# Patient Record
Sex: Female | Born: 1970 | Race: White | Hispanic: No | Marital: Married | Smoking: Never smoker
Health system: Southern US, Community
[De-identification: ages and names within clinical notes are randomized; demographics above are authoritative.]

## PROBLEM LIST (undated history)

## (undated) DIAGNOSIS — E060 Acute thyroiditis: Secondary | ICD-10-CM

## (undated) DIAGNOSIS — E7800 Pure hypercholesterolemia, unspecified: Secondary | ICD-10-CM

## (undated) DIAGNOSIS — Z87442 Personal history of urinary calculi: Secondary | ICD-10-CM

## (undated) DIAGNOSIS — Z9889 Other specified postprocedural states: Secondary | ICD-10-CM

## (undated) DIAGNOSIS — E519 Thiamine deficiency, unspecified: Secondary | ICD-10-CM

## (undated) DIAGNOSIS — R05 Cough: Secondary | ICD-10-CM

## (undated) DIAGNOSIS — K429 Umbilical hernia without obstruction or gangrene: Secondary | ICD-10-CM

## (undated) DIAGNOSIS — E069 Thyroiditis, unspecified: Secondary | ICD-10-CM

## (undated) DIAGNOSIS — G4700 Insomnia, unspecified: Secondary | ICD-10-CM

## (undated) DIAGNOSIS — I10 Essential (primary) hypertension: Secondary | ICD-10-CM

## (undated) DIAGNOSIS — C801 Malignant (primary) neoplasm, unspecified: Secondary | ICD-10-CM

## (undated) DIAGNOSIS — R059 Cough, unspecified: Secondary | ICD-10-CM

## (undated) DIAGNOSIS — Z973 Presence of spectacles and contact lenses: Secondary | ICD-10-CM

## (undated) DIAGNOSIS — R002 Palpitations: Secondary | ICD-10-CM

## (undated) DIAGNOSIS — F411 Generalized anxiety disorder: Secondary | ICD-10-CM

## (undated) DIAGNOSIS — E785 Hyperlipidemia, unspecified: Secondary | ICD-10-CM

## (undated) DIAGNOSIS — G4730 Sleep apnea, unspecified: Secondary | ICD-10-CM

## (undated) DIAGNOSIS — N329 Bladder disorder, unspecified: Secondary | ICD-10-CM

## (undated) DIAGNOSIS — R112 Nausea with vomiting, unspecified: Secondary | ICD-10-CM

## (undated) DIAGNOSIS — S93402A Sprain of unspecified ligament of left ankle, initial encounter: Secondary | ICD-10-CM

## (undated) DIAGNOSIS — F329 Major depressive disorder, single episode, unspecified: Secondary | ICD-10-CM

## (undated) DIAGNOSIS — M2550 Pain in unspecified joint: Secondary | ICD-10-CM

## (undated) DIAGNOSIS — G2581 Restless legs syndrome: Secondary | ICD-10-CM

## (undated) HISTORY — PX: OTHER SURGICAL HISTORY: SHX169

## (undated) HISTORY — DX: Pure hypercholesterolemia, unspecified: E78.00

## (undated) HISTORY — DX: Thyroiditis, unspecified: E06.9

## (undated) HISTORY — DX: Thiamine deficiency, unspecified: E51.9

## (undated) HISTORY — PX: UMBILICAL HERNIA REPAIR: SHX196

## (undated) HISTORY — DX: Insomnia, unspecified: G47.00

## (undated) HISTORY — DX: Cough: R05

## (undated) HISTORY — DX: Acute thyroiditis: E06.0

## (undated) HISTORY — DX: Sprain of unspecified ligament of left ankle, initial encounter: S93.402A

## (undated) HISTORY — DX: Umbilical hernia without obstruction or gangrene: K42.9

## (undated) HISTORY — DX: Restless legs syndrome: G25.81

## (undated) HISTORY — DX: Pain in unspecified joint: M25.50

## (undated) HISTORY — PX: GALLBLADDER SURGERY: SHX652

## (undated) HISTORY — DX: Hyperlipidemia, unspecified: E78.5

## (undated) HISTORY — DX: Cough, unspecified: R05.9

## (undated) HISTORY — DX: Generalized anxiety disorder: F41.1

## (undated) HISTORY — DX: Major depressive disorder, single episode, unspecified: F32.9

## (undated) HISTORY — DX: Palpitations: R00.2

## (undated) HISTORY — DX: Essential (primary) hypertension: I10

---
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
## 1993-06-16 HISTORY — PX: CHOLECYSTECTOMY: SHX55

## 1998-08-01 ENCOUNTER — Other Ambulatory Visit: Admission: RE | Admit: 1998-08-01 | Discharge: 1998-08-01 | Payer: Self-pay | Admitting: Gynecology

## 1999-09-09 ENCOUNTER — Other Ambulatory Visit: Admission: RE | Admit: 1999-09-09 | Discharge: 1999-09-09 | Payer: Self-pay | Admitting: Gynecology

## 2000-09-22 ENCOUNTER — Other Ambulatory Visit: Admission: RE | Admit: 2000-09-22 | Discharge: 2000-09-22 | Payer: Self-pay | Admitting: Gynecology

## 2001-10-28 ENCOUNTER — Other Ambulatory Visit: Admission: RE | Admit: 2001-10-28 | Discharge: 2001-10-28 | Payer: Self-pay | Admitting: Gynecology

## 2002-06-16 DIAGNOSIS — E060 Acute thyroiditis: Secondary | ICD-10-CM

## 2002-06-16 HISTORY — DX: Acute thyroiditis: E06.0

## 2002-10-11 ENCOUNTER — Encounter (HOSPITAL_COMMUNITY): Admission: RE | Admit: 2002-10-11 | Discharge: 2003-01-09 | Payer: Self-pay | Admitting: Endocrinology

## 2002-10-12 ENCOUNTER — Encounter: Payer: Self-pay | Admitting: Endocrinology

## 2002-10-27 ENCOUNTER — Other Ambulatory Visit: Admission: RE | Admit: 2002-10-27 | Discharge: 2002-10-27 | Payer: Self-pay | Admitting: Gynecology

## 2003-10-03 ENCOUNTER — Ambulatory Visit (HOSPITAL_COMMUNITY): Admission: RE | Admit: 2003-10-03 | Discharge: 2003-10-03 | Payer: Self-pay | Admitting: Family Medicine

## 2004-02-27 ENCOUNTER — Other Ambulatory Visit: Admission: RE | Admit: 2004-02-27 | Discharge: 2004-02-27 | Payer: Self-pay | Admitting: Gynecology

## 2004-10-03 ENCOUNTER — Emergency Department (HOSPITAL_COMMUNITY): Admission: EM | Admit: 2004-10-03 | Discharge: 2004-10-04 | Payer: Self-pay | Admitting: Emergency Medicine

## 2005-07-31 ENCOUNTER — Other Ambulatory Visit: Admission: RE | Admit: 2005-07-31 | Discharge: 2005-07-31 | Payer: Self-pay | Admitting: Gynecology

## 2008-01-28 ENCOUNTER — Emergency Department (HOSPITAL_COMMUNITY): Admission: EM | Admit: 2008-01-28 | Discharge: 2008-01-29 | Payer: Self-pay | Admitting: Emergency Medicine

## 2009-06-16 DIAGNOSIS — R002 Palpitations: Secondary | ICD-10-CM

## 2009-06-16 HISTORY — DX: Palpitations: R00.2

## 2010-05-13 ENCOUNTER — Emergency Department (HOSPITAL_COMMUNITY): Admission: EM | Admit: 2010-05-13 | Discharge: 2010-05-13 | Payer: Self-pay | Admitting: Emergency Medicine

## 2010-08-28 LAB — POCT CARDIAC MARKERS
CKMB, poc: <1 ng/mL — ABNORMAL LOW (ref 1.0–8.0)
Myoglobin, poc: 32.1 ng/mL (ref 12–200)
Troponin i, poc: <0.05 ng/mL (ref 0.00–0.09)

## 2010-08-28 LAB — DIFFERENTIAL
Basophils Relative: 0 % (ref 0–1)
Eosinophils Absolute: 0.1 10*3/uL (ref 0.0–0.7)
Eosinophils Relative: 1 % (ref 0–5)
Lymphs Abs: 2.8 10*3/uL (ref 0.7–4.0)
Monocytes Absolute: 0.6 10*3/uL (ref 0.1–1.0)
Monocytes Relative: 5 % (ref 3–12)
Neutrophils Relative %: 74 % (ref 43–77)

## 2010-08-28 LAB — POCT I-STAT, CHEM 8
Calcium, Ion: 1.09 mmol/L — ABNORMAL LOW (ref 1.12–1.32)
Creatinine, Ser: 0.7 mg/dL (ref 0.4–1.2)
Glucose, Bld: 118 mg/dL — ABNORMAL HIGH (ref 70–99)
HCT: 43 % (ref 36.0–46.0)
Hemoglobin: 14.6 g/dL (ref 12.0–15.0)
Potassium: 3.6 mEq/L (ref 3.5–5.1)
TCO2: 23 mmol/L (ref 0–100)

## 2010-08-28 LAB — CBC
Hemoglobin: 14.2 g/dL (ref 12.0–15.0)
MCH: 31.5 pg (ref 26.0–34.0)
MCHC: 35.1 g/dL (ref 30.0–36.0)
MCV: 89.7 fL (ref 78.0–100.0)

## 2010-08-28 LAB — URINALYSIS, ROUTINE W REFLEX MICROSCOPIC
Nitrite: NEGATIVE
Specific Gravity, Urine: 1.015 (ref 1.005–1.030)
Urobilinogen, UA: 1 mg/dL (ref 0.0–1.0)

## 2010-08-28 LAB — URINE MICROSCOPIC-ADD ON

## 2010-08-28 LAB — URINE CULTURE: Culture: NO GROWTH

## 2010-10-15 DIAGNOSIS — S93402A Sprain of unspecified ligament of left ankle, initial encounter: Secondary | ICD-10-CM

## 2010-10-15 HISTORY — DX: Sprain of unspecified ligament of left ankle, initial encounter: S93.402A

## 2010-11-11 ENCOUNTER — Emergency Department (INDEPENDENT_AMBULATORY_CARE_PROVIDER_SITE_OTHER): Payer: 59

## 2010-11-11 ENCOUNTER — Emergency Department (HOSPITAL_BASED_OUTPATIENT_CLINIC_OR_DEPARTMENT_OTHER)
Admission: EM | Admit: 2010-11-11 | Discharge: 2010-11-11 | Disposition: A | Discharge status: Home / Self Care | Payer: 59 | Attending: Emergency Medicine | Admitting: Emergency Medicine

## 2010-11-11 DIAGNOSIS — M25579 Pain in unspecified ankle and joints of unspecified foot: Secondary | ICD-10-CM

## 2010-11-11 DIAGNOSIS — Y9239 Other specified sports and athletic area as the place of occurrence of the external cause: Secondary | ICD-10-CM | POA: Insufficient documentation

## 2010-11-11 DIAGNOSIS — Y92838 Other recreation area as the place of occurrence of the external cause: Secondary | ICD-10-CM | POA: Insufficient documentation

## 2010-11-11 DIAGNOSIS — X500XXA Overexertion from strenuous movement or load, initial encounter: Secondary | ICD-10-CM | POA: Insufficient documentation

## 2010-11-11 DIAGNOSIS — I10 Essential (primary) hypertension: Secondary | ICD-10-CM | POA: Insufficient documentation

## 2010-11-11 DIAGNOSIS — S93409A Sprain of unspecified ligament of unspecified ankle, initial encounter: Secondary | ICD-10-CM | POA: Insufficient documentation

## 2010-11-11 DIAGNOSIS — R609 Edema, unspecified: Secondary | ICD-10-CM

## 2010-11-11 DIAGNOSIS — Y9353 Activity, golf: Secondary | ICD-10-CM | POA: Insufficient documentation

## 2011-05-04 ENCOUNTER — Encounter: Payer: Self-pay | Admitting: Emergency Medicine

## 2011-05-04 ENCOUNTER — Observation Stay (HOSPITAL_COMMUNITY)
Admission: EM | Admit: 2011-05-04 | Discharge: 2011-05-04 | Disposition: A | Discharge status: Home / Self Care | Payer: 59 | Attending: Emergency Medicine | Admitting: Emergency Medicine

## 2011-05-04 ENCOUNTER — Emergency Department (HOSPITAL_COMMUNITY): Payer: 59

## 2011-05-04 DIAGNOSIS — F3289 Other specified depressive episodes: Secondary | ICD-10-CM | POA: Insufficient documentation

## 2011-05-04 DIAGNOSIS — L02219 Cutaneous abscess of trunk, unspecified: Principal | ICD-10-CM | POA: Insufficient documentation

## 2011-05-04 DIAGNOSIS — L03311 Cellulitis of abdominal wall: Secondary | ICD-10-CM

## 2011-05-04 DIAGNOSIS — I10 Essential (primary) hypertension: Secondary | ICD-10-CM | POA: Insufficient documentation

## 2011-05-04 DIAGNOSIS — F329 Major depressive disorder, single episode, unspecified: Secondary | ICD-10-CM | POA: Insufficient documentation

## 2011-05-04 DIAGNOSIS — R109 Unspecified abdominal pain: Secondary | ICD-10-CM | POA: Insufficient documentation

## 2011-05-04 HISTORY — DX: Bladder disorder, unspecified: N32.9

## 2011-05-04 LAB — CBC
Hemoglobin: 13.6 g/dL (ref 12.0–15.0)
MCH: 31.1 pg (ref 26.0–34.0)
MCHC: 33.6 g/dL (ref 30.0–36.0)
Platelets: 353 10*3/uL (ref 150–400)
RBC: 4.38 MIL/uL (ref 3.87–5.11)

## 2011-05-04 LAB — DIFFERENTIAL
Basophils Relative: 0 % (ref 0–1)
Eosinophils Absolute: 0.1 10*3/uL (ref 0.0–0.7)
Lymphs Abs: 2.5 10*3/uL (ref 0.7–4.0)
Monocytes Relative: 6 % (ref 3–12)
Neutro Abs: 6.9 10*3/uL (ref 1.7–7.7)
Neutrophils Relative %: 68 % (ref 43–77)

## 2011-05-04 LAB — POCT PREGNANCY, URINE: Preg Test, Ur: NEGATIVE

## 2011-05-04 LAB — BASIC METABOLIC PANEL
Calcium: 9.4 mg/dL (ref 8.4–10.5)
GFR calc Af Amer: >90 mL/min (ref >90)
GFR calc non Af Amer: >90 mL/min (ref >90)
Glucose, Bld: 90 mg/dL (ref 70–99)
Potassium: 4.3 mEq/L (ref 3.5–5.1)
Sodium: 140 mEq/L (ref 135–145)

## 2011-05-04 MED ORDER — CEPHALEXIN 500 MG PO CAPS: 500.0000 mg | ORAL_CAPSULE | Freq: Four times a day (QID) | ORAL | Status: AC

## 2011-05-04 MED ORDER — IOHEXOL 300 MG/ML  SOLN
100.0000 mL | Freq: Once | INTRAMUSCULAR | Status: AC | PRN
  Administered 2011-05-04: 100 mL via INTRAVENOUS

## 2011-05-04 NOTE — ED Provider Notes (Signed)
Medical screening examination/treatment/procedure(s) were conducted as a shared visit with non-physician practitioner(s) and myself.  I personally evaluated the patient during the encounter   Mirna Sutcliffe W Elvie Maines, MD 05/04/11 1613 

## 2011-05-04 NOTE — ED Notes (Signed)
Pt presents with erythema and swelling to abdomen for two days, pt states green drainage noted coming from abdomen

## 2011-05-04 NOTE — ED Provider Notes (Signed)
History     CSN: [id number] Arrival date & time: 05/04/2011  9:25 AM   First MD Initiated Contact with Patient 05/04/11 [phone]      Chief Complaint  Patient presents with  . Abdominal Pain    (Consider location/radiation/quality/duration/timing/severity/associated sxs/prior treatment) HPI  Past Medical History  Diagnosis Date  . Hypertension   . Depression   . Bladder disorder     Past Surgical History  Procedure Date  . Gallbladder surgery   . Tonsillectomy   . Eye surgery     Family History  Problem Relation Age of Onset  . Heart failure Mother     History  Substance Use Topics  . Smoking status: Never Smoker   . Smokeless tobacco: Not on file  . Alcohol Use: No    OB History    Grav Para Term Preterm Abortions TAB SAB Ect Mult Living                  Review of Systems  Allergies  Erythromycin; Codeine; and Penicillins  Home Medications   Current Outpatient Rx  Name Route Sig Dispense Refill  . BUPROPION HCL ER (XL) 300 MG PO TB24 Oral Take 300 mg by mouth daily.      Marland Kitchen FLUOXETINE HCL 40 MG PO CAPS Oral Take 40 mg by mouth daily.      Marland Kitchen METOPROLOL SUCCINATE 100 MG PO TB24 Oral Take 100 mg by mouth daily.      . CRYSELLE-28 PO Oral Take 1 tablet by mouth daily.        BP 137/77  Temp(Src) 98.1 F (36.7 C) (Oral)  Resp 18  SpO2 98%  Physical Exam  ED Course  Procedures (including critical care time)   Labs Reviewed  BASIC METABOLIC PANEL  CBC  DIFFERENTIAL  POCT PREGNANCY, URINE  POCT PREGNANCY, URINE   Ct Abdomen Pelvis W Contrast  05/04/2011  *RADIOLOGY REPORT*  Clinical Data: Abdominal pain, umbilical drainage  CT ABDOMEN AND PELVIS WITH CONTRAST  Technique:  Multidetector CT imaging of the abdomen and pelvis was performed following the standard protocol during bolus administration of intravenous contrast.  Contrast: OMNIPAQUE IOHEXOL 300 MG/ML IV SOLN  Comparison: 10/03/2003 from [facility]  Findings: Visualized lung bases  clear.  Unremarkable liver, spleen, pancreas, adrenal glands, kidneys, abdominal aorta, stomach, small bowel, appendix, colon.  Vascular clips in the gallbladder fossa.  Urinary bladder physiologically distended.  Uterus and adnexal regions unremarkable.  No ascites. No free air.  No hydronephrosis. Lumbar spine unremarkable.  There is a small umbilical hernia containing mesenteric fat.  There is skin thickening around the umbilicus with underlying inflammatory/edematous changes in the subcutaneous fat.  This does not extend deep to the abdominal fascia. There are no bowel loops in proximity to this process.  No discrete abscess.  IMPRESSION:  1.  Periumbilical skin thickening and regional subcutaneous inflammatory/edematous changes suggesting cellulitis.  No discrete abscess. 2.  Small umbilical hernia containing only mesenteric fat. 3.  No intra abdominal abnormality.  Original Report Authenticated By: Osa Craver, M.D.      Abdominal wall cellulitis   MDM  CT scan without abscess, patient with 9cm x 12cm area of cellulitis - area marked with pen and she will either return here in 2 days or follow up with her PCP in 2 days.  She knows to return with worsening of symptoms including, fever, chills, worsening of redness, increase in drainage.  Izola Price [place], [place] 05/04/11 1258

## 2011-05-04 NOTE — ED Provider Notes (Signed)
History     CSN: [id number] Arrival date & time: 05/04/2011  9:25 AM   First MD Initiated Contact with Patient 05/04/11 0955    PT SEEN WITH MEDICAL STUDENT FLOYD, I PERFORMED HISTORY AND PHYSICAL  Chief Complaint  Patient presents with  . Abdominal Pain     Patient is a 40 y.o. female presenting with abdominal pain. The history is provided by the patient.  Abdominal Pain The primary symptoms of the illness include abdominal pain. The primary symptoms of the illness do not include fever, fatigue, vomiting, dysuria or vaginal discharge. The current episode started 2 days ago. The onset of the illness was gradual. The problem has been gradually worsening.  Symptoms associated with the illness do not include chills or urgency.  pt reports that she has had pain/drainage from her umbilicus for past two days No trauma No h/o piercings No fever/vomiting Now starting have to redness around umbilicus Distant h/o cholecystectomy, no recent surgeries  Past Medical History  Diagnosis Date  . Hypertension   . Depression   . Bladder disorder     Past Surgical History  Procedure Date  . Gallbladder surgery   . Tonsillectomy   . Eye surgery     Family History  Problem Relation Age of Onset  . Heart failure Mother     History  Substance Use Topics  . Smoking status: Never Smoker   . Smokeless tobacco: Not on file  . Alcohol Use: No    OB History    Grav Para Term Preterm Abortions TAB SAB Ect Mult Living                  Review of Systems  Constitutional: Negative for fever, chills and fatigue.  Gastrointestinal: Positive for abdominal pain. Negative for vomiting.  Genitourinary: Negative for dysuria, urgency and vaginal discharge.  All other systems reviewed and are negative.    Allergies  Erythromycin; Codeine; and Penicillins  Home Medications   Current Outpatient Rx  Name Route Sig Dispense Refill  . BUPROPION HCL ER (XL) 300 MG PO TB24 Oral Take 300 mg by  mouth daily.      Marland Kitchen FLUOXETINE HCL 40 MG PO CAPS Oral Take 40 mg by mouth daily.      Marland Kitchen METOPROLOL SUCCINATE 100 MG PO TB24 Oral Take 100 mg by mouth daily.      . CRYSELLE-28 PO Oral Take 1 tablet by mouth daily.        BP 137/77  Temp(Src) 98.1 F (36.7 C) (Oral)  Resp 18  SpO2 98%  Physical Exam  CONSTITUTIONAL: Well developed/well nourished HEAD AND FACE: Normocephalic/atraumatic EYES: EOMI/PERRL ENMT: Mucous membranes moist NECK: supple no meningeal signs SPINE:entire spine nontender CV: S1/S2 noted, no murmurs/rubs/gallops noted LUNGS: Lungs are clear to auscultation bilaterally, no apparent distress ABDOMEN: soft, mild tenderness around umbilicus.  There is a foul smelling/clear discharge from umbilicus with tenderness noted.  Surrounding erythema is noted, but no obvious fluctuance/crepitance Rest of abd exam unremarkable NEURO: Pt is awake/alert, moves all extremitiesx4 EXTREMITIES: pulses normal, full ROM SKIN: warm, color normal PSYCH: no abnormalities of mood noted   ED Course  Procedures (   Labs Reviewed  BASIC METABOLIC PANEL  CBC  DIFFERENTIAL  POCT PREGNANCY, URINE   No results found.      10:54 AM Plan is to place in CDU holding, obtain CT abd/pelvis.  Also, d/w frances sanford who will follow patient in CDU  MDM  Nursing notes reviewed and  considered in documentation All labs/vitals reviewed and considered         Joya Gaskins, MD 05/04/11 [phone]

## 2011-07-18 HISTORY — PX: UMBILICAL HERNIA REPAIR: SHX196

## 2011-08-11 ENCOUNTER — Encounter (INDEPENDENT_AMBULATORY_CARE_PROVIDER_SITE_OTHER): Payer: Self-pay | Admitting: Surgery

## 2011-08-20 ENCOUNTER — Ambulatory Visit (INDEPENDENT_AMBULATORY_CARE_PROVIDER_SITE_OTHER): Payer: 59 | Admitting: Surgery

## 2011-08-20 ENCOUNTER — Encounter (INDEPENDENT_AMBULATORY_CARE_PROVIDER_SITE_OTHER): Payer: Self-pay | Admitting: Surgery

## 2011-08-20 VITALS — BP 108/72 | HR 94 | Temp 97.0°F | Ht 61.0 in | Wt 245.4 lb

## 2011-08-20 DIAGNOSIS — K429 Umbilical hernia without obstruction or gangrene: Secondary | ICD-10-CM

## 2011-08-20 NOTE — Patient Instructions (Signed)
Umbilical Herniorrhaphy A herniorrhaphy is surgery to repair a hernia. A hernia is a gap or weakness in the muscles of your abdomen. Umbilical means that your hernia is in the area around your belly button. You might be able to feel a small bulge in your abdomen where the hernia is. You might also have pain or discomfort. If the hernia is not repaired, the gap could get bigger. Your intestines could get trapped in the gap. This can be painful. It also can lead to other health problems, such as blocked intestines. LET YOUR CAREGIVER KNOW ABOUT:  Any allergies.   All medications you are taking, including:   Herbs, eyedrops, over-the-counter medications and cream   Blood thinners (anticoagulants), aspirin or other drugs that could affect blood clotting.   Use of steroids (by mouth or as creams).   Previous problems with anesthetics, including local anesthetics.   Possibility of pregnancy, if this applies.   Any history of blood clots.   Any history of bleeding or other blood problems.   Previous surgery.   Smoking history.   Other health problems.  RISKS AND COMPLICATIONS  Short-term possibilities include:   Pain.   Excessive bleeding.   Hematoma. This is a pooling of blood under the wound.   Infection at the surgery site or of the mesh.   Numbness at the surgery site.   Swelling and bruising.   Slow healing.   Blood clots.   Intestine or bowel damage. This is rare.   Longer-term possibilities include:   Scarring.   Skin damage.   The need for additional surgery.   Another hernia.  BEFORE THE PROCEDURE  Stop using aspirin and non-steroidal anti-inflammatory drugs (NSAIDs) for pain relief. Also stop taking vitamin E. If possible, do this two weeks in advance.   If you take blood-thinners, ask your healthcare provider when you should stop taking them.   Do not eat or drink for about 8 hours before your surgery.   You might be asked to shower or wash with a  special antibacterial soap before the procedure.   Wear clothes that will be easy to put on after the surgery.   Arrive at least an hour before the surgery, or whenever your surgeon recommends. This will give you time to check in and fill out any needed paperwork.   This surgery is usually an outpatient procedure, so you will be able to go home the same day. Less often people stay overnight in the hospital after the procedure. Ask your healthcare provider what to expect. Either way, make arrangements in advance for someone to drive you home. After an outpatient procedure, you should have someone stay with you overnight.  PROCEDURE Your procedure can be done with traditional surgery. The surgeon opens the abdomen and repairs the hernia. Or, sometimes it can be done with laparoscopic surgery. Then the procedure is done through multiple small incisions, using a camera to guide the repair. Talk with your surgeon about how the hernia will be repaired.  The preparation:   You will change into a hospital gown.   You will be given an IV. A needle will be inserted in your arm. Medication can flow directly into your body through this needle.   You might be given a sedative to help you relax.   You may be given a drug that will put you to sleep during the surgery (general anesthetic). Or, your abdomen will be numbed, and you will be drowsy but awake (local   anesthetic).   For a traditional surgery (sometimes called open surgery):   Once you are pain-free, the surgeon will make a small cut (incision) in your abdomen.   The gap in the muscle wall will be repaired. The surgeon could sew muscle together over the gap. Or, a mesh or soft screen material can be used to strengthen the area. The mesh acts as a scaffolding and the body grows new strong tissue into and around it. This new tissue is what closes the gap of the hernia and prevents it from coming back.   A drain might be put in. Fluid sometimes  collects under the wound as it heals. The drain helps get the fluid out of the area. A drain will probably be used if your hernia is large.   The surgeon will close the incision with small stitches.   For a laparoscopic surgery:   One you are pain-free, the surgeon will make a small incision in your abdomen.   A thin tube with a tiny camera attached to it will be inserted into the abdomen through the incision. What the camera "sees" is projected on a screen in the room. This gives the surgeon a good view of the hernia.   Other small incisions will be made so the surgeon can insert small tools that are used to repair the hernia.   The incisions will be closed with small stitches.  AFTER THE PROCEDURE  You will be stay in a recovery area until the anesthesia has worn off. Your blood pressure and pulse will be checked every so often.   You might be asked to get up and try walking.   Sometimes people can go home the same day. For others, an overnight stay is needed.  HOME CARE INSTRUCTIONS   Take any medication that your surgeon prescribed. Follow the directions carefully. Take all of the medication.   Ask your surgeon whether you can take over-the-counter medicines for pain, discomfort or fever. Do not take aspirin unless your healthcare team says that you should. Aspirin increases the chances of bleeding.   Do not get the incision area wet for the first few days after surgery (or until your surgeon says it is OK).   Avoid lifting heavy objects (more than 10 lbs, 4.5 kilograms) for 6 to 8 weeks after your surgery.   Expect some pain when climbing stairs for several days after surgery.   Avoid sexual activity for a few weeks. It can be uncomfortable or painful.   You should be able to drive within a few days. However, do not drive until you are no longer taking pain medicine. It can make you drowsy. It also can slow your reaction time.   You should be able to resume normal activity in  a few days. When you can return to work will depend on the type of work you do. You can go back to a desk job much sooner than you can return to work that requires physical labor. Talk about this with your healthcare provider.  SEEK MEDICAL CARE IF:   You notice blood or fluid leaking from the wound.   The area around the incision becomes red or swollen.   You are having problems urinating.   You become nauseous or throw up for more than two days after the surgery.   You develop a fever of more than 100.5 F (38.1 C).  SEEK IMMEDIATE MEDICAL CARE IF:  You develop a fever of more than   102.0 F (38.9 C). Document Released: 08/29/2008 Document Revised: 05/22/2011 Document Reviewed: 08/29/2008 Exit[employer] Patient Information 2012 [employer]. 

## 2011-08-20 NOTE — Progress Notes (Signed)
Chief Complaint  Patient presents with  . Pre-op Exam    eval umb hernia - referral by Dr. Laurann Montana    HISTORY: Patient is a 41 year old white female referred by her primary care physician for evaluation of umbilical hernia. Patient has a history of laparoscopic cholecystectomy approximately 17 years ago. Patient had a recent episode of cellulitis and drainage involving the umbilicus. She was seen in the emergency department and in her primary care physician's office. She was treated with oral antibiotics with resolution of the cellulitis and drainage. On followup exam she was felt to have an umbilical hernia. She is now referred to surgery for further evaluation and management.  Patient has had no other episodes of infection. She has had no further drainage. Laparoscopic cholecystectomy as her only abdominal surgical procedure.  Past Medical History  Diagnosis Date  . Hypertension   . Depression   . Bladder disorder   . Left ankle sprain   . Hernia     umblical  . Pain in joint, site unspecified   . Acute thyroiditis   . Other and unspecified manifestations of thiamine deficiency   . Anxiety state, unspecified   . Insomnia, unspecified   . Pure hypercholesterolemia      Current Outpatient Prescriptions  Medication Sig Dispense Refill  . atorvastatin (LIPITOR) 20 MG tablet Take 20 mg by mouth daily.      Marland Kitchen buPROPion (WELLBUTRIN XL) 300 MG 24 hr tablet Take 300 mg by mouth daily.        Marland Kitchen FLUoxetine (PROZAC) 40 MG capsule Take 40 mg by mouth daily.        . metoprolol (TOPROL-XL) 100 MG 24 hr tablet Take 100 mg by mouth daily.        . Norgestrel-Ethinyl Estradiol (CRYSELLE-28 PO) Take 1 tablet by mouth daily.           Allergies  Allergen Reactions  . Erythromycin   . Codeine     hallunication  . Lisinopril   . Penicillins     Throat swelling     Family History  Problem Relation Age of Onset  . Heart failure Mother   . Heart disease Mother      History    Social History  . Marital Status: Married    Spouse Name: N/A    Number of Children: N/A  . Years of Education: N/A   Social History Main Topics  . Smoking status: Never Smoker   . Smokeless tobacco: None  . Alcohol Use: No  . Drug Use: No  . Sexually Active:    Other Topics Concern  . None   Social History Narrative  . None     REVIEW OF SYSTEMS - PERTINENT POSITIVES ONLY: No recurrent infections. No significant pain. No signs or symptoms of intestinal obstruction.  EXAM: Filed Vitals:   08/20/11 0946  BP: 108/72  Pulse: 94  Temp: 97 F (36.1 C)    HEENT: normocephalic; pupils equal and reactive; sclerae clear; dentition good; mucous membranes moist NECK:  symmetric on extension; no palpable anterior or posterior cervical lymphadenopathy; no supraclavicular masses; no tenderness CHEST: clear to auscultation bilaterally without rales, rhonchi, or wheezes CARDIAC: regular rate and rhythm without significant murmur; peripheral pulses are full ABDOMEN: soft without distension; bowel sounds present; no mass; no hepatosplenomegaly; Well healed incisions consistent with laparoscopic cholecystectomy. No cellulitis. Palpation of the umbilicus with Valsalva and sit up maneuver shows laxity of the abdominal wall and suspected umbilical hernia. EXT:  non-tender without edema; no deformity NEURO: no gross focal deficits; no sign of tremor   LABORATORY RESULTS: See [facility] ([facility]Epic) for most recent results   RADIOLOGY RESULTS: See [facility] (CHL-Epic) for most recent results   IMPRESSION: #1 suspected umbilical hernia, reducible #2 history of cellulitis involving umbilicus  PLAN: The patient and I discussed options for management. I think the most expeditious treatment will be surgical exploration with repair of umbilical or incisional hernia as the case may be. Certainly if there is any evidence of residual infection or fluid collection, this needs to be  dealt with and mesh repair may not be possible due to the risk of infection. I have discussed this with the patient and she understands. We will make arrangements for surgery as an outpatient procedure in the near future. We have discussed her recovery and time out of work. We have discussed limitations on her activities.  The risks and benefits of the procedure have been discussed at length with the patient.  The patient understands the proposed procedure, potential alternative treatments, and the course of recovery to be expected.  All of the patient's questions have been answered at this time.  The patient wishes to proceed with surgery and will schedule a date for the procedure through our office staff.   Velora Heckler, MD, FACS General & Endocrine Surgery [facility]   Visit Diagnoses: 1. Umbilical hernia     Primary Care Physician: Cala Bradford, MD, MD

## 2011-09-05 DIAGNOSIS — K420 Umbilical hernia with obstruction, without gangrene: Secondary | ICD-10-CM

## 2011-09-09 ENCOUNTER — Ambulatory Visit (INDEPENDENT_AMBULATORY_CARE_PROVIDER_SITE_OTHER): Payer: 59 | Admitting: Surgery

## 2011-09-09 ENCOUNTER — Encounter (INDEPENDENT_AMBULATORY_CARE_PROVIDER_SITE_OTHER): Payer: Self-pay | Admitting: Surgery

## 2011-09-09 VITALS — BP 138/90 | HR 80 | Temp 97.6°F | Resp 18 | Ht 62.0 in | Wt 240.2 lb

## 2011-09-09 DIAGNOSIS — K429 Umbilical hernia without obstruction or gangrene: Secondary | ICD-10-CM

## 2011-09-09 NOTE — Progress Notes (Signed)
Visit Diagnoses: 1. Umbilical hernia     HISTORY: The patient returns for her first postoperative visit having undergone umbilical hernia repair with mesh at surgical center of [address]. During the patient's procedure, she sustained a mild fall in the operating room. She returns today for wound check and physical exam.  EXAM: Abdominal incision is healing nicely. Steri-Strips remain in place. Mild soft tissue swelling. No sign of infection. No sign of recurrent hernia.  Examination of the patient's head neck and shoulders back buttocks and lower extremities showed no sign of trauma. No ecchymosis. No limitation in range of motion. No deformity.  IMPRESSION: Status post umbilical hernia repair with mesh  PLAN: Instructions for local wound care given. The patient will begin applying topical creams to the incision once the Steri-Strips have come off.  I reviewed the events following her surgery with the patient and her husband at length. I answered all their questions. They are reassured.  Patient will return to see me in one week for wound check and to discuss the timing of her return to work.  Velora Heckler, MD, FACS General & Endocrine Surgery [facility]

## 2011-09-09 NOTE — Patient Instructions (Signed)
  COCOA BUTTER & VITAMIN E CREAM  ([employer] or other brand)  Apply cocoa butter/vitamin E cream to your incision 2 - 3 times daily.  Massage cream into incision for one minute with each application.  Use sunscreen (50 SPF or higher) for first 6 months after surgery if area is exposed to sun.  You may substitute Mederma or other scar reducing creams as desired.   

## 2011-09-14 ENCOUNTER — Encounter (HOSPITAL_COMMUNITY): Payer: Self-pay | Admitting: *Deleted

## 2011-09-14 ENCOUNTER — Emergency Department (HOSPITAL_COMMUNITY): Payer: 59

## 2011-09-14 ENCOUNTER — Inpatient Hospital Stay (HOSPITAL_COMMUNITY)
Admission: EM | Admit: 2011-09-14 | Discharge: 2011-09-16 | DRG: 920 | Disposition: A | Discharge status: Home / Self Care | Payer: 59 | Attending: Surgery | Admitting: Surgery

## 2011-09-14 DIAGNOSIS — L0390 Cellulitis, unspecified: Secondary | ICD-10-CM

## 2011-09-14 DIAGNOSIS — L02219 Cutaneous abscess of trunk, unspecified: Secondary | ICD-10-CM | POA: Diagnosis present

## 2011-09-14 DIAGNOSIS — F411 Generalized anxiety disorder: Secondary | ICD-10-CM | POA: Diagnosis present

## 2011-09-14 DIAGNOSIS — G4700 Insomnia, unspecified: Secondary | ICD-10-CM | POA: Diagnosis present

## 2011-09-14 DIAGNOSIS — Y838 Other surgical procedures as the cause of abnormal reaction of the patient, or of later complication, without mention of misadventure at the time of the procedure: Secondary | ICD-10-CM | POA: Diagnosis present

## 2011-09-14 DIAGNOSIS — Z6841 Body Mass Index (BMI) 40.0 and over, adult: Secondary | ICD-10-CM

## 2011-09-14 DIAGNOSIS — IMO0002 Reserved for concepts with insufficient information to code with codable children: Principal | ICD-10-CM | POA: Diagnosis present

## 2011-09-14 DIAGNOSIS — I10 Essential (primary) hypertension: Secondary | ICD-10-CM | POA: Diagnosis present

## 2011-09-14 DIAGNOSIS — K429 Umbilical hernia without obstruction or gangrene: Secondary | ICD-10-CM | POA: Diagnosis present

## 2011-09-14 DIAGNOSIS — E7800 Pure hypercholesterolemia, unspecified: Secondary | ICD-10-CM | POA: Diagnosis present

## 2011-09-14 DIAGNOSIS — E785 Hyperlipidemia, unspecified: Secondary | ICD-10-CM | POA: Diagnosis present

## 2011-09-14 DIAGNOSIS — E669 Obesity, unspecified: Secondary | ICD-10-CM | POA: Diagnosis present

## 2011-09-14 DIAGNOSIS — R109 Unspecified abdominal pain: Secondary | ICD-10-CM | POA: Diagnosis present

## 2011-09-14 LAB — CBC
HCT: 40.5 % (ref 36.0–46.0)
Hemoglobin: 13.5 g/dL (ref 12.0–15.0)
MCH: 29.9 pg (ref 26.0–34.0)
MCHC: 33.3 g/dL (ref 30.0–36.0)
MCV: 89.8 fL (ref 78.0–100.0)
Platelets: 401 K/uL — ABNORMAL HIGH (ref 150–400)
RBC: 4.51 MIL/uL (ref 3.87–5.11)
RDW: 13.9 % (ref 11.5–15.5)
WBC: 10.5 K/uL (ref 4.0–10.5)

## 2011-09-14 LAB — BASIC METABOLIC PANEL WITH GFR
BUN: 8 mg/dL (ref 6–23)
CO2: 23 meq/L (ref 19–32)
Calcium: 9.4 mg/dL (ref 8.4–10.5)
Chloride: 103 meq/L (ref 96–112)
Creatinine, Ser: 0.68 mg/dL (ref 0.50–1.10)
GFR calc Af Amer: >90 mL/min
GFR calc non Af Amer: >90 mL/min
Glucose, Bld: 106 mg/dL — ABNORMAL HIGH (ref 70–99)
Potassium: 3.5 meq/L (ref 3.5–5.1)
Sodium: 138 meq/L (ref 135–145)

## 2011-09-14 LAB — DIFFERENTIAL
Basophils Absolute: 0 K/uL (ref 0.0–0.1)
Basophils Relative: 0 % (ref 0–1)
Eosinophils Absolute: 0.3 K/uL (ref 0.0–0.7)
Eosinophils Relative: 3 % (ref 0–5)
Lymphocytes Relative: 30 % (ref 12–46)
Lymphs Abs: 3.1 K/uL (ref 0.7–4.0)
Monocytes Absolute: 0.7 K/uL (ref 0.1–1.0)
Monocytes Relative: 7 % (ref 3–12)
Neutro Abs: 6.3 K/uL (ref 1.7–7.7)
Neutrophils Relative %: 60 % (ref 43–77)

## 2011-09-14 LAB — GRAM STAIN

## 2011-09-14 MED ORDER — FLUOXETINE HCL 40 MG PO CAPS: 40.0000 mg | ORAL_CAPSULE | Freq: Every day | ORAL | Status: DC

## 2011-09-14 MED ORDER — SENNOSIDES-DOCUSATE SODIUM 8.6-50 MG PO TABS: 1.0000 | ORAL_TABLET | Freq: Every evening | ORAL | Status: DC | PRN

## 2011-09-14 MED ORDER — SODIUM CHLORIDE 0.9 % IV SOLN
INTRAVENOUS | Status: DC
  Administered 2011-09-14: 20 mL/h via INTRAVENOUS

## 2011-09-14 MED ORDER — ONDANSETRON HCL 4 MG/2ML IJ SOLN
4.0000 mg | Freq: Once | INTRAMUSCULAR | Status: AC
  Administered 2011-09-14: 4 mg via INTRAVENOUS
  Filled 2011-09-14: qty 2

## 2011-09-14 MED ORDER — DOCUSATE SODIUM 100 MG PO CAPS
100.0000 mg | ORAL_CAPSULE | Freq: Two times a day (BID) | ORAL | Status: DC
  Administered 2011-09-15 (×2): 100 mg via ORAL
  Filled 2011-09-14 (×3): qty 1

## 2011-09-14 MED ORDER — DIPHENHYDRAMINE HCL 25 MG PO CAPS
50.0000 mg | ORAL_CAPSULE | Freq: Once | ORAL | Status: AC
  Administered 2011-09-14: 50 mg via ORAL
  Filled 2011-09-14: qty 2

## 2011-09-14 MED ORDER — BUPROPION HCL ER (XL) 300 MG PO TB24
300.0000 mg | ORAL_TABLET | Freq: Every day | ORAL | Status: DC
  Administered 2011-09-15 – 2011-09-16 (×2): 300 mg via ORAL
  Filled 2011-09-14 (×2): qty 1

## 2011-09-14 MED ORDER — ACETAMINOPHEN 650 MG RE SUPP: 650.0000 mg | Freq: Four times a day (QID) | RECTAL | Status: DC | PRN

## 2011-09-14 MED ORDER — METOPROLOL SUCCINATE ER 100 MG PO TB24
100.0000 mg | ORAL_TABLET | Freq: Every day | ORAL | Status: DC
  Administered 2011-09-15 – 2011-09-16 (×2): 100 mg via ORAL
  Filled 2011-09-14 (×2): qty 1

## 2011-09-14 MED ORDER — HYDROMORPHONE HCL PF 1 MG/ML IJ SOLN
INTRAMUSCULAR | Status: AC
  Filled 2011-09-14: qty 1

## 2011-09-14 MED ORDER — DIPHENHYDRAMINE HCL 25 MG PO CAPS: 25.0000 mg | ORAL_CAPSULE | Freq: Four times a day (QID) | ORAL | Status: DC | PRN

## 2011-09-14 MED ORDER — VANCOMYCIN HCL IN DEXTROSE 1-5 GM/200ML-% IV SOLN
1000.0000 mg | Freq: Three times a day (TID) | INTRAVENOUS | Status: DC
  Administered 2011-09-14 – 2011-09-15 (×2): 1000 mg via INTRAVENOUS
  Filled 2011-09-14 (×4): qty 200

## 2011-09-14 MED ORDER — NORGESTREL-ETHINYL ESTRADIOL 0.3-30 MG-MCG PO TABS: 1.0000 | ORAL_TABLET | Freq: Every day | ORAL | Status: DC

## 2011-09-14 MED ORDER — ACETAMINOPHEN 325 MG PO TABS
650.0000 mg | ORAL_TABLET | Freq: Four times a day (QID) | ORAL | Status: DC | PRN
  Administered 2011-09-15: 650 mg via ORAL
  Filled 2011-09-14: qty 2

## 2011-09-14 MED ORDER — SIMVASTATIN 40 MG PO TABS
40.0000 mg | ORAL_TABLET | Freq: Every day | ORAL | Status: DC
  Administered 2011-09-14 – 2011-09-15 (×2): 40 mg via ORAL
  Filled 2011-09-14 (×3): qty 1

## 2011-09-14 MED ORDER — HYDROMORPHONE HCL PF 1 MG/ML IJ SOLN
1.0000 mg | Freq: Once | INTRAMUSCULAR | Status: AC
  Administered 2011-09-14: 1 mg via INTRAVENOUS

## 2011-09-14 MED ORDER — FLUOXETINE HCL 20 MG PO CAPS
40.0000 mg | ORAL_CAPSULE | Freq: Every day | ORAL | Status: DC
  Administered 2011-09-15 – 2011-09-16 (×2): 40 mg via ORAL
  Filled 2011-09-14 (×2): qty 2

## 2011-09-14 MED ORDER — HYDROCODONE-ACETAMINOPHEN 5-325 MG PO TABS
1.0000 | ORAL_TABLET | Freq: Four times a day (QID) | ORAL | Status: DC | PRN
  Administered 2011-09-14 – 2011-09-16 (×5): 1 via ORAL
  Filled 2011-09-14 (×5): qty 1

## 2011-09-14 MED ORDER — IOHEXOL 300 MG/ML  SOLN
100.0000 mL | Freq: Once | INTRAMUSCULAR | Status: AC | PRN
  Administered 2011-09-14: 100 mL via INTRAVENOUS

## 2011-09-14 MED ORDER — HYDROMORPHONE HCL PF 1 MG/ML IJ SOLN
1.0000 mg | Freq: Once | INTRAMUSCULAR | Status: AC
  Administered 2011-09-14: 1 mg via INTRAVENOUS
  Filled 2011-09-14: qty 1

## 2011-09-14 MED ORDER — ONDANSETRON HCL 4 MG/2ML IJ SOLN
4.0000 mg | Freq: Four times a day (QID) | INTRAMUSCULAR | Status: DC | PRN
  Administered 2011-09-15 (×3): 4 mg via INTRAVENOUS
  Filled 2011-09-14 (×3): qty 2

## 2011-09-14 MED ORDER — ENOXAPARIN SODIUM 40 MG/0.4ML ~~LOC~~ SOLN
40.0000 mg | SUBCUTANEOUS | Status: DC
  Administered 2011-09-15 – 2011-09-16 (×2): 40 mg via SUBCUTANEOUS
  Filled 2011-09-14 (×2): qty 0.4

## 2011-09-14 MED ORDER — HYDROMORPHONE HCL PF 1 MG/ML IJ SOLN: 1.0000 mg | INTRAMUSCULAR | Status: DC | PRN

## 2011-09-14 MED ORDER — HYDROCODONE-ACETAMINOPHEN 5-500 MG PO TABS: 1.0000 | ORAL_TABLET | Freq: Four times a day (QID) | ORAL | Status: DC | PRN

## 2011-09-14 NOTE — Progress Notes (Signed)
ANTIBIOTIC CONSULT NOTE - INITIAL  Pharmacy Consult for Vancomycin Indication: Hx Cellulitis, Early Wound Infection  Allergies  Allergen Reactions  . Erythromycin   . Codeine     hallunication  . Lisinopril   . Penicillins     Throat swelling    Patient Measurements:  Weight = 109 kg (09/09/11) Height = 62 in (09/09/11) Vital Signs: Temp: 98.3 F (36.8 C) (03/31 1811) Temp src: Oral (03/31 1811) BP: 135/79 mmHg (03/31 1811) Pulse Rate: 91  (03/31 1811) Intake/Output from previous day:   Intake/Output from this shift:    Labs:  [facility] 09/14/11 1926  WBC 10.5  HGB 13.5  PLT 401*  LABCREA --  CREATININE 0.68   The CrCl is unknown because both a height and weight (above a minimum accepted value) are required for this calculation. No results found for this basename: VANCOTROUGH:2,VANCOPEAK:2,VANCORANDOM:2,GENTTROUGH:2,GENTPEAK:2,GENTRANDOM:2,TOBRATROUGH:2,TOBRAPEAK:2,TOBRARND:2,AMIKACINPEAK:2,AMIKACINTROU:2,AMIKACIN:2, in the last 72 hours   Microbiology: No results found for this or any previous visit (from the past 720 hour(s)).  Medical History: Past Medical History  Diagnosis Date  . Hypertension   . Depression   . Bladder disorder   . Left ankle sprain   . Hernia     umblical  . Pain in joint, site unspecified   . Acute thyroiditis   . Other and unspecified manifestations of thiamine deficiency   . Anxiety state, unspecified   . Insomnia, unspecified   . Pure hypercholesterolemia     Medications:  Anti-infectives    None     Assessment: 41yo female s/p umbilical hernia repair on March 22nd and recent hx of cellulitis and drainage involving the umbilicus that resolved with oral antibiotics now in the ED with worsening erythema around the incision to start IV Vancomycin. Patient has allergy to Penicillin.   WBC 10.5, SCr 0.68/estCrCl >148ml/min, Tm 98.3  Goal of Therapy:  Vancomycin trough level 10-15 mcg/ml  Plan:  1. Vancomycin 1g IV q8h.    2. Follow-up cultures and renal function.   Link Snuffer, PharmD, BCPS Clinical Pharmacist [phone] 09/14/2011,8:27 PM

## 2011-09-14 NOTE — ED Notes (Signed)
MD at bedside. edpa sanford

## 2011-09-14 NOTE — ED Notes (Signed)
The pt has umbilical hernia surgery on the 22nd. She has drainage from the surgical incision that started yesterday and in small amounts.  Today the drainage has increased  And she is using maxi pads to  Help control the drainage.  Unknown if temp

## 2011-09-14 NOTE — ED Notes (Signed)
Pt reports umbilical hernia surgery last Friday, noticed increase drainage from site yesterday, pt reports applying a new maxi pad every 2 hrs. Redness noted to the area, thin bloody drainage noted, pt also c/o generalized weakness all over, nausea, and increase pain to lower abd. Pt denies V/D.

## 2011-09-14 NOTE — ED Provider Notes (Signed)
History     CSN: [id number]  Arrival date & time 09/14/11  1807   First MD Initiated Contact with Patient 09/14/11 1855      Chief Complaint  Patient presents with  . drainage from surgical site     (Consider location/radiation/quality/duration/timing/severity/associated sxs/prior treatment) HPI Comments: Patient with recent umbilical hernia surgical repair by Dr. Gerrit Friends on the 22nd presents with a day history of serosangenous drainage from the incision site as well as redness and increase in pain.  States she had been recovering well after this but starting yesterday she began to notice a small amount of drainage - she reports today she has been soaking a maxi pad about every 2 hours.  She denies real fever (though she reports her temperature to be elevated above her baseline).  She reports increase in redness and pain in the area as well.  Has spoken with Dr. Luisa Hart who suggested if this did not lessen for the patient to come here for further evaluation.  Patient is a 41 y.o. female presenting with abdominal pain. The history is provided by the patient and the spouse. No language interpreter was used.  Abdominal Pain The primary symptoms of the illness include abdominal pain. The primary symptoms of the illness do not include fever, fatigue, shortness of breath, nausea, vomiting, diarrhea, hematemesis, hematochezia, dysuria, vaginal discharge or vaginal bleeding. The current episode started yesterday. The onset of the illness was gradual. The problem has been rapidly worsening.  The patient states that she believes she is currently not pregnant. The patient has not had a change in bowel habit. Symptoms associated with the illness do not include chills, anorexia, diaphoresis, heartburn, constipation, urgency, hematuria, frequency or back pain.    Past Medical History  Diagnosis Date  . Hypertension   . Depression   . Bladder disorder   . Left ankle sprain   . Hernia     umblical  .  Pain in joint, site unspecified   . Acute thyroiditis   . Other and unspecified manifestations of thiamine deficiency   . Anxiety state, unspecified   . Insomnia, unspecified   . Pure hypercholesterolemia     Past Surgical History  Procedure Date  . Gallbladder surgery   . Tonsillectomy   . Eye surgery   . Cholecystectomy 1995    Family History  Problem Relation Age of Onset  . Heart failure Mother   . Heart disease Mother     History  Substance Use Topics  . Smoking status: Never Smoker   . Smokeless tobacco: Not on file  . Alcohol Use: No    OB History    Grav Para Term Preterm Abortions TAB SAB Ect Mult Living                  Review of Systems  Constitutional: Negative for fever, chills, diaphoresis and fatigue.  Respiratory: Negative for shortness of breath.   Gastrointestinal: Positive for abdominal pain. Negative for heartburn, nausea, vomiting, diarrhea, constipation, hematochezia, anorexia and hematemesis.  Genitourinary: Negative for dysuria, urgency, frequency, hematuria, vaginal bleeding and vaginal discharge.  Musculoskeletal: Negative for back pain.  All other systems reviewed and are negative.    Allergies  Erythromycin; Codeine; Lisinopril; and Penicillins  Home Medications   Current Outpatient Rx  Name Route Sig Dispense Refill  . ATORVASTATIN CALCIUM 20 MG PO TABS Oral Take 20 mg by mouth daily.    . BUPROPION HCL ER (XL) 300 MG PO TB24 Oral Take 300  mg by mouth daily.      Marland Kitchen FLUOXETINE HCL 40 MG PO CAPS Oral Take 40 mg by mouth daily.      Marland Kitchen HYDROCODONE-ACETAMINOPHEN 5-500 MG PO TABS Oral Take 1 tablet by mouth every 6 (six) hours as needed.     Marland Kitchen METOPROLOL SUCCINATE ER 100 MG PO TB24 Oral Take 100 mg by mouth daily.      . CRYSELLE-28 PO Oral Take 1 tablet by mouth daily.        BP 135/79  Pulse 91  Temp(Src) 98.3 F (36.8 C) (Oral)  Resp 20  SpO2 100%  LMP 08/14/2011  Physical Exam  Nursing note and vitals  reviewed. Constitutional: She is oriented to person, place, and time. She appears well-developed and well-nourished. No distress.  HENT:  Head: Normocephalic and atraumatic.  Right Ear: External ear normal.  Left Ear: External ear normal.  Nose: Nose normal.  Mouth/Throat: Oropharynx is clear and moist. No oropharyngeal exudate.  Eyes: Conjunctivae are normal. Pupils are equal, round, and reactive to light. No scleral icterus.  Neck: Normal range of motion. Neck supple.  Cardiovascular: Normal rate, regular rhythm and normal heart sounds.  Exam reveals no gallop and no friction rub.   No murmur heard. Pulmonary/Chest: Effort normal and breath sounds normal. No respiratory distress. She has no wheezes. She has no rales. She exhibits no tenderness.  Abdominal: Soft. Bowel sounds are normal. She exhibits no distension and no mass. There is tenderness in the periumbilical area. There is no rebound and no guarding.    Musculoskeletal: Normal range of motion. She exhibits no edema and no tenderness.  Lymphadenopathy:    She has no cervical adenopathy.  Neurological: She is alert and oriented to person, place, and time. No cranial nerve deficit.  Skin: Skin is warm and dry. No rash noted. There is erythema. No pallor.  Psychiatric: She has a normal mood and affect. Her behavior is normal. Judgment and thought content normal.    ED Course  Procedures (including critical care time)   Labs Reviewed  CBC  DIFFERENTIAL  BASIC METABOLIC PANEL   No results found.   No diagnosis found.    MDM  Dr. Weldon Inches in to see the patient with me and has spoken with Dr. Lindie Spruce who recommends we get a CT scan and he will see the patient after that is complete.        Izola Price [place], [place] 09/14/11 1929

## 2011-09-14 NOTE — ED Notes (Signed)
Dr wyatt at bedside to assess surgical incision, site marked by dr wyatt. abd pad applied with medipore tape. Medicated per MD orders

## 2011-09-14 NOTE — ED Provider Notes (Signed)
Medical screening examination/treatment/procedure(s) were conducted as a shared visit with non-physician practitioner(s) and myself.  I personally evaluated the patient during the encounter 41 year old, female, who had an umbilical hernia repair on March 22, presents with drainage from her incision since yesterday.  She says it is copious.  Today, she developed erythema around the incision.  She denies fevers, chills, nausea, vomiting.  On examination.  She is morbidly obese.  Her incision is dry.  Now.  However, there is surrounding tenderness and erythema extending approximately 4 cm above the incision.  All the way down to her mons pubis and from the right hip following to the left hip, which is approximately 2 feet in diameter. I spoke with Dr. Lindie Spruce.  He recommended.  A CAT scan and said that he would evaluate the patient, as well.  Cheri Guppy, MD 09/14/11 [phone]

## 2011-09-14 NOTE — H&P (Signed)
Amanda Baker is an 41 y.o. female.   Chief Complaint: Drainage from surgical wound HPI: The problem started yesterday with a small amount of drainage from her infra-umbilical incision done about a week ago by Dr. Gerrit Friends for an umbilical hernia repair.  Mesh was used.  The drainage is slightly cloudy, and by report not foul-smelling.  Past Medical History  Diagnosis Date  . Hypertension   . Depression   . Bladder disorder   . Left ankle sprain   . Hernia     umblical  . Pain in joint, site unspecified   . Acute thyroiditis   . Other and unspecified manifestations of thiamine deficiency   . Anxiety state, unspecified   . Insomnia, unspecified   . Pure hypercholesterolemia     Past Surgical History  Procedure Date  . Gallbladder surgery   . Tonsillectomy   . Eye surgery   . Cholecystectomy 1995    Family History  Problem Relation Age of Onset  . Heart failure Mother   . Heart disease Mother    Social History:  reports that she has never smoked. She does not have any smokeless tobacco history on file. She reports that she does not drink alcohol or use illicit drugs.  Allergies:  Allergies  Allergen Reactions  . Erythromycin   . Codeine     hallunication  . Lisinopril   . Penicillins     Throat swelling    Medications Prior to Admission  Medication Dose Route Frequency Provider Last Rate Last Dose  . HYDROmorphone (DILAUDID) injection 1 mg  1 mg Intravenous Once Scarlette Calico C. [place], [place]   1 mg at 09/14/11 1931  . HYDROmorphone (DILAUDID) injection 1 mg  1 mg Intravenous Once Cherylynn Ridges, MD   1 mg at 09/14/11 2007  . ondansetron (ZOFRAN) injection 4 mg  4 mg Intravenous Once Scarlette Calico C. [place], [place]   4 mg at 09/14/11 1931   Medications Prior to Admission  Medication Sig Dispense Refill  . atorvastatin (LIPITOR) 20 MG tablet Take 20 mg by mouth daily.      Marland Kitchen buPROPion (WELLBUTRIN XL) 300 MG 24 hr tablet Take 300 mg by mouth daily.        Marland Kitchen FLUoxetine (PROZAC) 40 MG  capsule Take 40 mg by mouth daily.        Marland Kitchen HYDROcodone-acetaminophen (VICODIN) 5-500 MG per tablet Take 1 tablet by mouth every 6 (six) hours as needed.       . metoprolol (TOPROL-XL) 100 MG 24 hr tablet Take 100 mg by mouth daily.        . Norgestrel-Ethinyl Estradiol (CRYSELLE-28 PO) Take 1 tablet by mouth daily.          Results for orders placed during the hospital encounter of 09/14/11 (from the past 48 hour(s))  CBC     Status: Abnormal   Collection Time   09/14/11  7:26 PM      Component Value Range Comment   WBC 10.5  4.0 - 10.5 (K/uL)    RBC 4.51  3.87 - 5.11 (MIL/uL)    Hemoglobin 13.5  12.0 - 15.0 (g/dL)    HCT 11.9  14.7 - 82.9 (%)    MCV 89.8  78.0 - 100.0 (fL)    MCH 29.9  26.0 - 34.0 (pg)    MCHC 33.3  30.0 - 36.0 (g/dL)    RDW 56.2  13.0 - 86.5 (%)    Platelets 401 (*) 150 - 400 (K/uL)  DIFFERENTIAL     Status: Normal   Collection Time   09/14/11  7:26 PM      Component Value Range Comment   Neutrophils Relative 60  43 - 77 (%)    Neutro Abs 6.3  1.7 - 7.7 (K/uL)    Lymphocytes Relative 30  12 - 46 (%)    Lymphs Abs 3.1  0.7 - 4.0 (K/uL)    Monocytes Relative 7  3 - 12 (%)    Monocytes Absolute 0.7  0.1 - 1.0 (K/uL)    Eosinophils Relative 3  0 - 5 (%)    Eosinophils Absolute 0.3  0.0 - 0.7 (K/uL)    Basophils Relative 0  0 - 1 (%)    Basophils Absolute 0.0  0.0 - 0.1 (K/uL)   BASIC METABOLIC PANEL     Status: Abnormal   Collection Time   09/14/11  7:26 PM      Component Value Range Comment   Sodium 138  135 - 145 (mEq/L)    Potassium 3.5  3.5 - 5.1 (mEq/L)    Chloride 103  96 - 112 (mEq/L)    CO2 23  19 - 32 (mEq/L)    Glucose, Bld 106 (*) 70 - 99 (mg/dL)    BUN 8  6 - 23 (mg/dL)    Creatinine, Ser 1.61  0.50 - 1.10 (mg/dL)    Calcium 9.4  8.4 - 10.5 (mg/dL)    GFR calc non Af Amer >90  >90 (mL/min)    GFR calc Af Amer >90  >90 (mL/min)    No results found.  Review of Systems  Constitutional: Positive for fever (low grade). Negative for chills and  weight loss.  HENT: Negative.   Eyes: Negative.   Respiratory: Negative.   Cardiovascular: Negative.   Gastrointestinal: Positive for abdominal pain (umbilical and peri-umbilical).  Genitourinary: Negative.   Musculoskeletal: Negative.   Skin: Negative.   Endo/Heme/Allergies: Negative.   Psychiatric/Behavioral: Negative.     Blood pressure 135/79, pulse 91, temperature 98.3 F (36.8 C), temperature source Oral, resp. rate 20, last menstrual period 08/14/2011, SpO2 100.00%. Physical Exam  Constitutional: She is oriented to person, place, and time. She appears well-developed and well-nourished.  HENT:  Head: Normocephalic and atraumatic.  Eyes: Conjunctivae and EOM are normal. Pupils are equal, round, and reactive to light.  Neck: Normal range of motion.  Cardiovascular: Normal rate, regular rhythm, normal heart sounds and intact distal pulses.   Respiratory: Effort normal and breath sounds normal.  GI: Soft. Bowel sounds are normal. There is tenderness (around umbilicus and down to pannus below) in the periumbilical area. No hernia. Hernia confirmed negative in the ventral area.    Musculoskeletal: Normal range of motion.  Neurological: She is alert and oriented to person, place, and time. She has normal reflexes.  Skin: Skin is warm. There is erythema (periumbilical erythema and into pannus area about 10 x 20cm).  Psychiatric: She has a normal mood and affect. Her behavior is normal. Judgment and thought content normal.     Assessment/Plan 1.  Wound seroma with possible early infection--patient has a history of previous cellulitis of the umbilicus treated with antibiotics;  After a betadine prep the wound was probed with a Q-tip and approximately 50cc of cloudy (not mucopurulent) fluid was drained with some discomfort.  Collection taken for gram stain and C&S were sent.   Will put the patient on IV Vancomycin, mark the area of erythema, admit for IV antibiotics and pain  control.   Will leave a message for Dr. Gerrit Friends, but I believe that he is on vacation.  Will then admit to [facility] surgical service for management.   Rynell Ciotti III,[place] [facility] 09/14/2011, 8:21 PM

## 2011-09-15 ENCOUNTER — Encounter (INDEPENDENT_AMBULATORY_CARE_PROVIDER_SITE_OTHER): Payer: 59 | Admitting: Surgery

## 2011-09-15 LAB — BASIC METABOLIC PANEL
BUN: 7 mg/dL (ref 6–23)
CO2: 23 mEq/L (ref 19–32)
Chloride: 104 mEq/L (ref 96–112)
Creatinine, Ser: 0.7 mg/dL (ref 0.50–1.10)
GFR calc Af Amer: >90 mL/min (ref >90)
Glucose, Bld: 107 mg/dL — ABNORMAL HIGH (ref 70–99)
Potassium: 3.6 mEq/L (ref 3.5–5.1)

## 2011-09-15 LAB — CBC
HCT: 37.1 % (ref 36.0–46.0)
Hemoglobin: 12.3 g/dL (ref 12.0–15.0)
MCH: 29.8 pg (ref 26.0–34.0)
MCHC: 33.2 g/dL (ref 30.0–36.0)
MCV: 89.8 fL (ref 78.0–100.0)

## 2011-09-15 MED ORDER — DOXYCYCLINE HYCLATE 100 MG PO TABS
100.0000 mg | ORAL_TABLET | Freq: Two times a day (BID) | ORAL | Status: DC
  Administered 2011-09-15 – 2011-09-16 (×3): 100 mg via ORAL
  Filled 2011-09-15 (×5): qty 1

## 2011-09-15 NOTE — Progress Notes (Signed)
Patient ID: Amanda Baker, female   DOB: [birth year], 41 y.o.   MRN: [id number]    Subjective: Pt feeling better today.  Still having drainage.  Nervous about going home.  Objective: Vital signs in last 24 hours: Temp:  [98.2 F (36.8 C)-98.6 F (37 C)] 98.5 F (36.9 C) (04/01 0534) Pulse Rate:  [81-97] 82  (04/01 0534) Resp:  [16-20] 18  (04/01 0534) BP: (105-135)/(58-79) 126/73 mmHg (04/01 0534) SpO2:  [96 %-100 %] 100 % (04/01 0534) Weight:  [240 lb 1.3 oz (108.9 kg)] 240 lb 1.3 oz (108.9 kg) (03/31 2220) Last BM Date: 09/14/11  Intake/Output from previous day: 03/31 0701 - 04/01 0700 In: -  Out: 400 [Urine:400] Intake/Output this shift:    PE: Abd: soft, cellulitis almost completely resolved.  Still with some serous drainage from wound, but less.  Lab Results:   Basename 09/15/11 0620 09/14/11 1926  WBC 9.1 10.5  HGB 12.3 13.5  HCT 37.1 40.5  PLT 364 401*   BMET  Basename 09/15/11 0620 09/14/11 1926  NA 138 138  K 3.6 3.5  CL 104 103  CO2 23 23  GLUCOSE 107* 106*  BUN 7 8  CREATININE 0.70 0.68  CALCIUM 9.1 9.4   PT/INR No results found for this basename: LABPROT:2,INR:2 in the last 72 hours CMP     Component Value Date/Time   NA 138 09/15/2011 0620   K 3.6 09/15/2011 0620   CL 104 09/15/2011 0620   CO2 23 09/15/2011 0620   GLUCOSE 107* 09/15/2011 0620   BUN 7 09/15/2011 0620   CREATININE 0.70 09/15/2011 0620   CALCIUM 9.1 09/15/2011 0620   GFRNONAA >90 09/15/2011 0620   GFRAA >90 09/15/2011 0620   Lipase  No results found for this basename: lipase       Studies/Results: Ct Abdomen Pelvis W Contrast  09/14/2011  *RADIOLOGY REPORT*  Clinical Data: Recent umbilical hernia repair.  Pain, swelling and drainage from the wound.  CT ABDOMEN AND PELVIS WITH CONTRAST  Technique:  Multidetector CT imaging of the abdomen and pelvis was performed following the standard protocol during bolus administration of intravenous contrast.  Contrast: OMNIPAQUE IOHEXOL 300 MG/ML  IJ SOLN  Comparison: CT scan 05/04/2011.  Findings: There is a superficial abscess in the subcutaneous fat overlying the previous surgical site.  There is a small amount of inflammation and fluid intraabdominally but no discrete intra- abdominal abscess.  The lung bases are clear.  The solid abdominal organs are intact and are normal and stable.  The stomach, duodenum, small bowel and colon are unremarkable.  No findings for obstruction or inflammation.  The appendix is normal.  The uterus and ovaries are normal.  The bladder is normal.  No free pelvic fluid collections.  The bony structures are intact.  IMPRESSION: 9.4 x 7.4 x 4.5 cm abscess in the subcutaneous fat overlying the previous hernia repair.  There is a small amount of postoperative fluid deep to the abdominal wall musculature but no intra-abdominal abscess.  Original Report Authenticated By: P. Loralie Champagne, M.D.    Anti-infectives: Anti-infectives     Start     Dose/Rate Route Frequency Ordered Stop   09/14/11 2100   vancomycin (VANCOCIN) IVPB 1000 mg/200 mL premix        1,000 mg 200 mL/hr over 60 Minutes Intravenous Every 8 hours 09/14/11 2037             Assessment/Plan  1. S/p UHR with seroma, cx pending  Patient  of Dr. Nat Christen.  Husband and daughter in room.  Needs to ambulate.  Overall looks better.  ? Home tomorrow.  Plan: 1. Patient is not comfortable going back home yet as she wants to make sure that this is resolving better prior to going back home. 2. Cont abx therapy 3. Await culture 4. Anticipate dc home tomorrow morning if all continues to improve.   LOS: 1 day    OSBORNE,KELLY E 09/15/2011  Ovidio Kin, MD, [facility] Pager: [phone] Office phone:  [phone]

## 2011-09-15 NOTE — ED Provider Notes (Signed)
Medical screening examination/treatment/procedure(s) were conducted as a shared visit with non-physician practitioner(s) and myself.  I personally evaluated the patient during the encounter  Cheri Guppy, MD 09/15/11 0000

## 2011-09-16 DIAGNOSIS — R109 Unspecified abdominal pain: Secondary | ICD-10-CM | POA: Diagnosis present

## 2011-09-16 DIAGNOSIS — E785 Hyperlipidemia, unspecified: Secondary | ICD-10-CM | POA: Diagnosis present

## 2011-09-16 DIAGNOSIS — IMO0002 Reserved for concepts with insufficient information to code with codable children: Secondary | ICD-10-CM | POA: Diagnosis present

## 2011-09-16 DIAGNOSIS — E669 Obesity, unspecified: Secondary | ICD-10-CM | POA: Diagnosis present

## 2011-09-16 DIAGNOSIS — L0390 Cellulitis, unspecified: Secondary | ICD-10-CM | POA: Diagnosis present

## 2011-09-16 DIAGNOSIS — I10 Essential (primary) hypertension: Secondary | ICD-10-CM | POA: Diagnosis present

## 2011-09-16 MED ORDER — DOXYCYCLINE HYCLATE 100 MG PO TABS: 100.0000 mg | ORAL_TABLET | Freq: Two times a day (BID) | ORAL | Status: AC

## 2011-09-16 MED ORDER — ONDANSETRON HCL 4 MG PO TABS: 4.0000 mg | ORAL_TABLET | Freq: Three times a day (TID) | ORAL | Status: AC | PRN

## 2011-09-16 NOTE — Discharge Instructions (Signed)
Pack a piece of 1/4in packing strip in wound to help keep skin open so seroma can finish draining.  Change 2 times a day.  Please call us if you develop a fever over 101.5.  May shower, removing packing first, then replace when done.

## 2011-09-16 NOTE — Discharge Summary (Signed)
Physician Discharge Summary  Patient ID: Amanda Baker MRN: [id number] DOB/AGE: Jan 23, 1971 41 y.o.  Admit date: 09/14/2011 Discharge date: 09/16/2011  Admission Diagnoses: Present on Admission:  .Umbilical hernia .Seroma, postoperative .Abdominal pain .Hypertension .Hyperlipidemia .Obesity .Cellulitis   Discharge Diagnoses:  Principal Problem:  *Seroma, postoperative Active Problems:  Umbilical hernia  Abdominal pain  Hypertension  Hyperlipidemia  Obesity  Cellulitis   Discharged Condition: fair  Hospital Course: 41 yo female with history obesity presents one week post-operatively from periumbilical hernia repair with abdominal pain, cellulitis and serous fluid leakage from incision. There was significant erythema and tenderness on lower abdomen consistent with cellulitis, and serous drainage from lower umbilical incision. On admission, this incision was probed and ~50 cc serous fluid removed. It continued to drain moderate amount through hospitalization, but patient remained afebrile without leukocytosis.   This post-operative fluid collection is consistent with sterile seroma as the wound culture showed no organisms on gram stain and no growth during hospitalization.  She was started empirically on vancomycin for cellulitis and erythema, tenderness had improved significantly after one day of therapy. She was transitioned to oral doxycycline to continue total 10 day course for cellulitis treatment. She and her husband were educated on wound dressing and superficial packing material to aid in active drainage of seroma, avoidance of re-accumulation of fluid pocket. Patient will follow up with her surgeon in 2 days.   Consults: None  Significant Diagnostic Studies: radiology: CT scan: 9.4 x 7.4 x 4.5 cm abscess in the subcutaneous fat overlying the previous hernia repair. There is a small amount of postoperative fluid deep to the abdominal wall musculature but no intra-abdominal  abscess.   Treatments: antibiotics: vancomycin  Discharge Exam: Blood pressure 106/60, pulse 90, temperature 98.2 F (36.8 C), temperature source Oral, resp. rate 18, height 5\' 2"  (1.575 m), weight 240 lb 1.3 oz (108.9 kg), last menstrual period 08/14/2011, SpO2 98.00%. General appearance: alert, cooperative and no distress Head: Normocephalic, without obvious abnormality, atraumatic Eyes: conjunctivae/corneas clear. PERRL, EOM's intact. Fundi benign. Resp: clear to auscultation bilaterally Cardio: regular rate and rhythm, S1, S2 normal, no murmur, click, rub or gallop GI: BS+, soft, obese. TTP infraumbilical area, mild scar tissue palpated, no abscesses palpated. Extremities: extremities normal, atraumatic, no cyanosis or edema Skin: Skin color, texture, turgor normal. No rashes or lesions Neurologic: Alert and oriented X 3, normal strength and tone. Normal symmetric reflexes. Normal coordination and gait Incision/Wound: Erythema resolved. Umbilical incision approximated and wnl. Infraumbilical incision is approximated with exception of 1-2 cm portion at left of midline, expresses serous fluid with palpation. Pink margins of granulation tissue.   Disposition: 01-Home or Self Care  Discharge Orders    Future Appointments: Provider: Department: Dept Phone: Center:   09/18/2011 11:00 AM Velora Heckler, MD [facility] [phone] None     Medication List  As of 09/16/2011  8:49 AM   TAKE these medications         atorvastatin 20 MG tablet   Commonly known as: LIPITOR   Take 20 mg by mouth daily.      buPROPion 300 MG 24 hr tablet   Commonly known as: WELLBUTRIN XL   Take 300 mg by mouth daily.      CRYSELLE-28 PO   Take 1 tablet by mouth daily.      doxycycline 100 MG tablet   Commonly known as: VIBRA-TABS   Take 1 tablet (100 mg total) by mouth every 12 (twelve) hours.      FLUoxetine  40 MG capsule   Commonly known as: PROZAC   Take 40 mg by mouth daily.       HYDROcodone-acetaminophen 5-500 MG per tablet   Commonly known as: VICODIN   Take 1 tablet by mouth every 6 (six) hours as needed.      metoprolol succinate 100 MG 24 hr tablet   Commonly known as: TOPROL-XL   Take 100 mg by mouth daily.      ondansetron 4 MG tablet   Commonly known as: ZOFRAN   Take 1 tablet (4 mg total) by mouth every 8 (eight) hours as needed for nausea.           Follow-up Information    Follow up with Cala Bradford, MD.      Follow up with Velora Heckler, MD. (already scheduled appointment)    Contact information:   [facility] [address] [place] [address] [phone]         She is to see Dr. Gerrit Friends on Thurs., 09/18/2011.  I have left him a message.  DN.  SignedLloyd Huger PGY-2 09/16/2011, 8:45 AM  Ovidio Kin, MD, [facility] Pager: [phone] Office phone:  [phone]

## 2011-09-16 NOTE — Progress Notes (Signed)
Patient discharged to home, instructions given and verbalized understanding, dressing changed and intact

## 2011-09-16 NOTE — Progress Notes (Signed)
Post d/c UR completed. Amanda Baker 09/16/2011

## 2011-09-17 LAB — WOUND CULTURE

## 2011-09-18 ENCOUNTER — Ambulatory Visit (INDEPENDENT_AMBULATORY_CARE_PROVIDER_SITE_OTHER): Payer: 59 | Admitting: Surgery

## 2011-09-18 ENCOUNTER — Encounter (INDEPENDENT_AMBULATORY_CARE_PROVIDER_SITE_OTHER): Payer: Self-pay | Admitting: Surgery

## 2011-09-18 VITALS — BP 118/82 | HR 76 | Temp 98.2°F | Resp 24 | Ht 63.0 in | Wt 237.0 lb

## 2011-09-18 DIAGNOSIS — L0390 Cellulitis, unspecified: Secondary | ICD-10-CM

## 2011-09-18 DIAGNOSIS — L0291 Cutaneous abscess, unspecified: Secondary | ICD-10-CM

## 2011-09-18 DIAGNOSIS — IMO0002 Reserved for concepts with insufficient information to code with codable children: Secondary | ICD-10-CM

## 2011-09-18 DIAGNOSIS — K429 Umbilical hernia without obstruction or gangrene: Secondary | ICD-10-CM

## 2011-09-18 DIAGNOSIS — R109 Unspecified abdominal pain: Secondary | ICD-10-CM

## 2011-09-18 NOTE — Progress Notes (Signed)
Visit Diagnoses: 1. Umbilical hernia   2. Abdominal pain   3. Cellulitis   4. Seroma, postoperative     HISTORY: Patient returns for wound check. She developed drainage and cellulitis over Easter weekend. She was admitted to the hospital for intravenous antibiotics with rapid resolution of the cellulitis. She was discharged home on oral doxycycline. She presents today for wound check.  EXAM: Incision below the umbilicus has separated slightly at the midportion. This was probed with a Q-tip. There is a small seroma which is evacuated. There is no purulent drainage. There is no sign of cellulitis. Wound is packed with half-inch iodoform gauze packing and dry gauze dressings are applied.  IMPRESSION: #1 status post umbilical hernia repair with mesh #2 postoperative seroma formation #3 cellulitis, resolved  PLAN: Wound care instructions are given. Patient will keep the wound covered with dry gauze. She may shower. I've asked her to remove approximately 1/2 inch of packing daily and trimmed off. She will return in 4 days for wound check here in the office. She will complete her course of oral doxycycline.  Velora Heckler, MD, FACS General & Endocrine Surgery [facility]

## 2011-09-18 NOTE — Patient Instructions (Signed)
Change gauze dressing as needed.  Remove 1/2 inch of wick daily and trim off.  May shower - cover wound while in shower.  tmg

## 2011-09-19 ENCOUNTER — Encounter (INDEPENDENT_AMBULATORY_CARE_PROVIDER_SITE_OTHER): Payer: Self-pay | Admitting: Surgery

## 2011-09-22 ENCOUNTER — Encounter (INDEPENDENT_AMBULATORY_CARE_PROVIDER_SITE_OTHER): Payer: Self-pay | Admitting: Surgery

## 2011-09-22 ENCOUNTER — Ambulatory Visit (INDEPENDENT_AMBULATORY_CARE_PROVIDER_SITE_OTHER): Payer: 59 | Admitting: Surgery

## 2011-09-22 VITALS — BP 126/80 | HR 72 | Temp 97.3°F | Resp 18 | Ht 63.0 in | Wt 237.1 lb

## 2011-09-22 DIAGNOSIS — K429 Umbilical hernia without obstruction or gangrene: Secondary | ICD-10-CM

## 2011-09-22 DIAGNOSIS — L0291 Cutaneous abscess, unspecified: Secondary | ICD-10-CM

## 2011-09-22 DIAGNOSIS — IMO0002 Reserved for concepts with insufficient information to code with codable children: Secondary | ICD-10-CM

## 2011-09-22 DIAGNOSIS — L0390 Cellulitis, unspecified: Secondary | ICD-10-CM

## 2011-09-22 MED ORDER — DOXYCYCLINE MONOHYDRATE 100 MG PO CAPS: 100.0000 mg | ORAL_CAPSULE | Freq: Two times a day (BID) | ORAL | Status: DC

## 2011-09-22 NOTE — Progress Notes (Signed)
Visit Diagnoses: 1. Umbilical hernia   2. Cellulitis   3. Seroma, postoperative     HISTORY: Patient returns today for wound check. She has been slowly advancing the wick. She has noted some drainage.  EXAM: No evidence of recurrent cellulitis. His wound is clean and intact. The wick is placed through the central portion of the wound into the deep subcutaneous tissue. There is slightly thick drainage from the incision. No obvious purulence. Weight is removed and replaced with half inch iodoform gauze packing.  IMPRESSION: #1 umbilical hernia, status post repair with mesh #2 postoperative seroma formation #3 postoperative cellulitis, resolving  PLAN: Dressing is changed today. Patient will return in 2 days for me to assess the wound. I have reviewed her prescription for doxycycline for an additional 10 days.  Velora Heckler, MD, FACS General & Endocrine Surgery [facility]

## 2011-09-22 NOTE — Patient Instructions (Signed)
Dressing changes as instructed. tmg

## 2011-09-23 ENCOUNTER — Other Ambulatory Visit (INDEPENDENT_AMBULATORY_CARE_PROVIDER_SITE_OTHER): Payer: Self-pay | Admitting: Surgery

## 2011-09-23 ENCOUNTER — Other Ambulatory Visit (INDEPENDENT_AMBULATORY_CARE_PROVIDER_SITE_OTHER): Payer: Self-pay

## 2011-09-23 ENCOUNTER — Telehealth (INDEPENDENT_AMBULATORY_CARE_PROVIDER_SITE_OTHER): Payer: Self-pay

## 2011-09-23 DIAGNOSIS — Z8719 Personal history of other diseases of the digestive system: Secondary | ICD-10-CM

## 2011-09-23 DIAGNOSIS — Z9889 Other specified postprocedural states: Secondary | ICD-10-CM

## 2011-09-23 LAB — CBC WITH DIFFERENTIAL/PLATELET
Lymphs Abs: 3 10*3/uL (ref 0.7–4.0)
MCH: 29.9 pg (ref 26.0–34.0)
MCHC: 33.4 g/dL (ref 30.0–36.0)
MCV: 89.4 fL (ref 78.0–100.0)
Platelets: 432 10*3/uL — ABNORMAL HIGH (ref 150–400)
RBC: 4.65 MIL/uL (ref 3.87–5.11)

## 2011-09-23 NOTE — Progress Notes (Signed)
done

## 2011-09-23 NOTE — Telephone Encounter (Signed)
C/o nausea, and fatigue. Drainage from abdomen incision little darker yellow. Per Dr. Gerrit Friends CBC with Diff today.  Patient has follow up on tomorrow, labs ordered STAT.  Patient aware.

## 2011-09-24 ENCOUNTER — Encounter (INDEPENDENT_AMBULATORY_CARE_PROVIDER_SITE_OTHER): Payer: Self-pay | Admitting: Surgery

## 2011-09-24 ENCOUNTER — Ambulatory Visit (INDEPENDENT_AMBULATORY_CARE_PROVIDER_SITE_OTHER): Payer: 59 | Admitting: Surgery

## 2011-09-24 VITALS — BP 118/82 | HR 104 | Temp 99.1°F | Resp 18 | Ht 63.0 in | Wt 236.4 lb

## 2011-09-24 DIAGNOSIS — Z9889 Other specified postprocedural states: Secondary | ICD-10-CM

## 2011-09-24 DIAGNOSIS — L0390 Cellulitis, unspecified: Secondary | ICD-10-CM

## 2011-09-24 DIAGNOSIS — IMO0002 Reserved for concepts with insufficient information to code with codable children: Secondary | ICD-10-CM

## 2011-09-24 DIAGNOSIS — L0291 Cutaneous abscess, unspecified: Secondary | ICD-10-CM

## 2011-09-24 DIAGNOSIS — K429 Umbilical hernia without obstruction or gangrene: Secondary | ICD-10-CM

## 2011-09-24 DIAGNOSIS — Z8719 Personal history of other diseases of the digestive system: Secondary | ICD-10-CM

## 2011-09-24 DIAGNOSIS — E669 Obesity, unspecified: Secondary | ICD-10-CM

## 2011-09-24 NOTE — Progress Notes (Signed)
Visit Diagnoses: 1. Cellulitis   2. Obesity   3. Seroma, postoperative   4. Umbilical hernia     HISTORY: The patient returns today for wound check and dressing change as instructed. A CBC from yesterday shows a normal white count of 8.0 with a normal differential.  EXAM: Surgical wound is clean without any evidence of cellulitis. There is a 1 cm opening in the central portion of the wound with iodoform packing in place. This is removed. There is serous drainage from the wound. There does not appear to be any purulence. Wound is probed to a depth of approximately 6 cm with a Q-tip. There is further drainage of serous fluid. A culture is taken and submitted to the laboratory.  Wound is then packed with half-inch iodoform gauze packing and dry gauze dressings were applied.  IMPRESSION: #1 umbilical versus incisional hernia, status post repair with mesh #2 postoperative cellulitis, resolved #3 postoperative seroma formation  PLAN: Patient and I discussed the above issues. She remains on antibiotics. Packing has been replaced. We will await the results of the culture taken today. Patient will be scheduled for a CT scan of the abdomen and pelvis to be performed on Friday, April 12. We will review those results in the results of the culture and then make a decision regarding further management at that time.  Velora Heckler, MD, FACS General & Endocrine Surgery [facility]

## 2011-09-24 NOTE — Patient Instructions (Signed)
Return to office for dressing change on Friday, 4/12, pending CT results. tmg

## 2011-09-26 ENCOUNTER — Telehealth (INDEPENDENT_AMBULATORY_CARE_PROVIDER_SITE_OTHER): Payer: Self-pay | Admitting: Surgery

## 2011-09-26 ENCOUNTER — Ambulatory Visit (INDEPENDENT_AMBULATORY_CARE_PROVIDER_SITE_OTHER): Payer: 59 | Admitting: Surgery

## 2011-09-26 ENCOUNTER — Encounter (INDEPENDENT_AMBULATORY_CARE_PROVIDER_SITE_OTHER): Payer: Self-pay | Admitting: Surgery

## 2011-09-26 ENCOUNTER — Ambulatory Visit
Admission: RE | Admit: 2011-09-26 | Discharge: 2011-09-26 | Disposition: A | Discharge status: Home / Self Care | Payer: 59 | Source: Ambulatory Visit | Attending: Surgery | Admitting: Surgery

## 2011-09-26 VITALS — BP 120/64 | HR 80 | Temp 98.6°F | Resp 16 | Ht 63.0 in | Wt 235.6 lb

## 2011-09-26 DIAGNOSIS — Z8719 Personal history of other diseases of the digestive system: Secondary | ICD-10-CM

## 2011-09-26 DIAGNOSIS — IMO0002 Reserved for concepts with insufficient information to code with codable children: Secondary | ICD-10-CM

## 2011-09-26 DIAGNOSIS — L0390 Cellulitis, unspecified: Secondary | ICD-10-CM

## 2011-09-26 NOTE — Telephone Encounter (Signed)
Just a FYI I put Mrs Gullickson is coming in to see Dr Gerrit Friends on 10-02-11 per Dr Corliss Skains

## 2011-09-26 NOTE — Telephone Encounter (Signed)
done

## 2011-09-26 NOTE — Progress Notes (Signed)
Urgent Office  This is a patient of Dr. Ardine Eng who is s/p umbilical hernia repair with mesh on 09/05/11.  She has had problems with a draining seroma coming from the mid-portion of her incision.  A CT scan was repeated today which showed that she continues to have a post-operative fluid collection in the soft tissues over her hernia repair, but it is noticeably smaller.    I removed the packing today and explored the wound with a cotton swab.  I tried to break up some of the loculations and was able to express a moderate amount of clear yellow seroma fluid.  I repacked the wound with about 5 cm of 1/4 inch NuGauze.  The patient will come in on Monday to see Dr. Ardine Eng nurse, and will come in next Thursday morning to see Dr. Gerrit Friends in the office.  Wilmon Arms. Corliss Skains, MD, [facility]  09/26/2011 5:04 PM

## 2011-09-29 ENCOUNTER — Ambulatory Visit (INDEPENDENT_AMBULATORY_CARE_PROVIDER_SITE_OTHER): Payer: 59

## 2011-09-29 DIAGNOSIS — Z4800 Encounter for change or removal of nonsurgical wound dressing: Secondary | ICD-10-CM

## 2011-09-29 NOTE — Progress Notes (Signed)
Patient arrived for nurse only visit 09/29/2011- BP 93/59- P 82- Temp 97.2- WT 237.4lbs- Amanda Baker stated she is feeling much better.  The pinkish stain dressing was removed- the packing was pulled. There was no odor on the dressing or the iodoform.  There was some drainage when the Q tip was inserted. The area was repacked with iodoform dressing. The patients abdominal incision  Was re-packed with iodoform. She tolerated it well. Next appointment 10/09/2011 with Dr. Gerrit Friends.

## 2011-10-02 ENCOUNTER — Ambulatory Visit (INDEPENDENT_AMBULATORY_CARE_PROVIDER_SITE_OTHER): Payer: 59 | Admitting: Surgery

## 2011-10-02 ENCOUNTER — Encounter (INDEPENDENT_AMBULATORY_CARE_PROVIDER_SITE_OTHER): Payer: Self-pay

## 2011-10-02 ENCOUNTER — Encounter (INDEPENDENT_AMBULATORY_CARE_PROVIDER_SITE_OTHER): Payer: Self-pay | Admitting: Surgery

## 2011-10-02 VITALS — BP 112/72 | HR 100 | Temp 97.2°F | Resp 22 | Ht 63.0 in | Wt 236.4 lb

## 2011-10-02 DIAGNOSIS — L0291 Cutaneous abscess, unspecified: Secondary | ICD-10-CM

## 2011-10-02 DIAGNOSIS — IMO0002 Reserved for concepts with insufficient information to code with codable children: Secondary | ICD-10-CM

## 2011-10-02 DIAGNOSIS — L0390 Cellulitis, unspecified: Secondary | ICD-10-CM

## 2011-10-02 DIAGNOSIS — K429 Umbilical hernia without obstruction or gangrene: Secondary | ICD-10-CM

## 2011-10-02 NOTE — Patient Instructions (Signed)
Wound care as instructed.  tmg

## 2011-10-02 NOTE — Progress Notes (Signed)
Visit Diagnoses: 1. Cellulitis   2. Seroma, postoperative   3. Umbilical hernia     HISTORY: Patient returns for wound check. Cultures are no growth to date. CT scan of the abdomen last week shows continued decrease in size of subcutaneous seroma cavity. No sign of intra-abdominal complication.  EXAM: Packing is removed from the central portion of the wound. The wound is probed to a depth of 6 cm. There is serous drainage only. Cavity appears smaller. Wound is packed with quarter inch iodoform gauze packing with dry dressings.  IMPRESSION: Status post umbilical hernia repair with mesh, postoperative cellulitis and seroma formation  PLAN: Wound care instructions were given to the patient. She will complete her course of antibiotics tomorrow. She will return in 4 days for wound check and dressing change.  Velora Heckler, MD, FACS General & Endocrine Surgery [facility]

## 2011-10-06 ENCOUNTER — Ambulatory Visit (INDEPENDENT_AMBULATORY_CARE_PROVIDER_SITE_OTHER): Payer: 59 | Admitting: Surgery

## 2011-10-06 ENCOUNTER — Encounter (INDEPENDENT_AMBULATORY_CARE_PROVIDER_SITE_OTHER): Payer: Self-pay | Admitting: Surgery

## 2011-10-06 VITALS — BP 118/72 | HR 80 | Temp 97.1°F | Resp 16 | Ht 63.0 in | Wt 237.6 lb

## 2011-10-06 DIAGNOSIS — K429 Umbilical hernia without obstruction or gangrene: Secondary | ICD-10-CM

## 2011-10-06 DIAGNOSIS — E669 Obesity, unspecified: Secondary | ICD-10-CM

## 2011-10-06 DIAGNOSIS — IMO0002 Reserved for concepts with insufficient information to code with codable children: Secondary | ICD-10-CM

## 2011-10-06 NOTE — Progress Notes (Signed)
Visit Diagnoses: 1. Seroma, postoperative   2. Umbilical hernia   3. Obesity     HISTORY: Patient returns for wound check and dressing change. Drainage has been stable. No significant change.  EXAM: Midportion of the wound has a small iodoform gauze wick. This was removed. Wound is probed to a depth of 6 cm with a Q-tip. The tract is quite narrow. There is serosanguineous drainage. No sign of cellulitis. No sign of infection.  IMPRESSION: Status post umbilical hernia repair with mesh, postoperative seroma and cellulitis  PLAN: Iodoform gauze packing is replaced. Usual instructions are given. Patient will return in 2 days for wound check and dressing change.  Velora Heckler, MD, FACS General & Endocrine Surgery [facility]

## 2011-10-06 NOTE — Patient Instructions (Signed)
Wound care as instructed.  Mirel Hundal M. Oline Belk, MD, FACS [facility] Office: [phone]   

## 2011-10-07 ENCOUNTER — Ambulatory Visit (INDEPENDENT_AMBULATORY_CARE_PROVIDER_SITE_OTHER): Payer: 59 | Admitting: Surgery

## 2011-10-07 ENCOUNTER — Telehealth (INDEPENDENT_AMBULATORY_CARE_PROVIDER_SITE_OTHER): Payer: Self-pay

## 2011-10-07 ENCOUNTER — Encounter (INDEPENDENT_AMBULATORY_CARE_PROVIDER_SITE_OTHER): Payer: Self-pay | Admitting: Surgery

## 2011-10-07 VITALS — BP 128/80 | HR 93 | Temp 97.8°F | Ht 63.0 in | Wt 236.4 lb

## 2011-10-07 DIAGNOSIS — L0390 Cellulitis, unspecified: Secondary | ICD-10-CM

## 2011-10-07 DIAGNOSIS — K429 Umbilical hernia without obstruction or gangrene: Secondary | ICD-10-CM

## 2011-10-07 DIAGNOSIS — IMO0002 Reserved for concepts with insufficient information to code with codable children: Secondary | ICD-10-CM

## 2011-10-07 DIAGNOSIS — L0291 Cutaneous abscess, unspecified: Secondary | ICD-10-CM

## 2011-10-07 MED ORDER — SULFAMETHOXAZOLE-TRIMETHOPRIM 800-160 MG PO TABS: 1.0000 | ORAL_TABLET | Freq: Two times a day (BID) | ORAL | Status: AC

## 2011-10-07 NOTE — Telephone Encounter (Signed)
C/O increase stabbing pain and drainage from hernia repair. Patient given appointment in urgent office today.

## 2011-10-07 NOTE — Progress Notes (Signed)
Visit Diagnoses: 1. Seroma, postoperative   2. Umbilical hernia   3. Cellulitis     HISTORY: The patient returns to the office today complaining of weakness, lightheadedness, and abdominal wall pain.  EXAM: Drainage from the umbilical wound is definitely more thick and darker in color.  There is moderate tenderness. There is no significant erythema or induration. Wound is probed with a Q-tip and there is no large abscess cavity. Wound is packed with iodoform gauze packing and dry gauze dressings are placed  IMPRESSION: #1 status post umbilical hernia repair with mesh #2 postoperative stroma #3 chronic drainage, evidence of cellulitis  PLAN: Patient will begin Bactrim DS twice daily for 14 days. Previous wound cultures have shown no growth but at that time she was taking doxycycline. I have asked the patient to return tomorrow for a dressing change.  Patient I have discussed options for management. As long as we feel like we are making progress, I will continue with the office management. If we fail to make progress or if the wound deteriorates, then she will require a return to the operating room for wound exploration and debridement and possible removal of the mesh. Patient understands.  Velora Heckler, MD, FACS General & Endocrine Surgery [facility]

## 2011-10-07 NOTE — Patient Instructions (Signed)
Wound care as instructed.  tmg 

## 2011-10-08 ENCOUNTER — Encounter (INDEPENDENT_AMBULATORY_CARE_PROVIDER_SITE_OTHER): Payer: Self-pay | Admitting: Surgery

## 2011-10-08 ENCOUNTER — Ambulatory Visit (INDEPENDENT_AMBULATORY_CARE_PROVIDER_SITE_OTHER): Payer: 59 | Admitting: Surgery

## 2011-10-08 VITALS — BP 110/72 | HR 74 | Temp 97.6°F | Resp 20 | Ht 63.0 in | Wt 236.2 lb

## 2011-10-08 DIAGNOSIS — L0291 Cutaneous abscess, unspecified: Secondary | ICD-10-CM

## 2011-10-08 DIAGNOSIS — IMO0002 Reserved for concepts with insufficient information to code with codable children: Secondary | ICD-10-CM

## 2011-10-08 DIAGNOSIS — L0390 Cellulitis, unspecified: Secondary | ICD-10-CM

## 2011-10-08 DIAGNOSIS — K429 Umbilical hernia without obstruction or gangrene: Secondary | ICD-10-CM

## 2011-10-08 NOTE — Progress Notes (Signed)
Visit Diagnoses: 1. Cellulitis   2. Seroma, postoperative   3. Umbilical hernia     HISTORY: The patient returns today for dressing change. She was seen yesterday and started on oral Bactrim.  EXAM: Packing is removed. There is serosanguineous drainage which is somewhat thick but not purulent. There is no cellulitis. Wound is packed with iodoform gauze and covered with dry gauze dressing.  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 She is on Bactrim. If she fails therapy, she may require wound exploration and possible removal of mesh.  Velora Heckler, MD, FACS General & Endocrine Surgery [facility]

## 2011-10-08 NOTE — Patient Instructions (Signed)
Dressing care as instructed.  Remain on antibiotics.  Velora Heckler, MD, [facility] Office: [phone]

## 2011-10-10 ENCOUNTER — Encounter (INDEPENDENT_AMBULATORY_CARE_PROVIDER_SITE_OTHER): Payer: 59

## 2011-10-10 ENCOUNTER — Ambulatory Visit (INDEPENDENT_AMBULATORY_CARE_PROVIDER_SITE_OTHER): Payer: 59 | Admitting: General Surgery

## 2011-10-10 VITALS — BP 122/78 | HR 70 | Temp 97.9°F | Resp 16 | Ht 63.0 in | Wt 238.0 lb

## 2011-10-10 DIAGNOSIS — IMO0002 Reserved for concepts with insufficient information to code with codable children: Secondary | ICD-10-CM

## 2011-10-10 NOTE — Progress Notes (Signed)
The patient returns for a wound packing changed with a history of possibly infected seroma post umbilical hernia repair with mesh. We have been changing the wound packing at least 3 times a week. She was changed to Bactrim on Tuesday. She feels it is about the same.  On exam I removed the for packing from the small opening in the incision. The fluid is cloudy but not obviously purulent or with any odor. There is no erythema. I repacked the cavity. She is to return in 3 days (Monday) for dressing change.

## 2011-10-13 ENCOUNTER — Ambulatory Visit (INDEPENDENT_AMBULATORY_CARE_PROVIDER_SITE_OTHER): Payer: 59 | Admitting: General Surgery

## 2011-10-13 ENCOUNTER — Encounter (INDEPENDENT_AMBULATORY_CARE_PROVIDER_SITE_OTHER): Payer: Self-pay | Admitting: General Surgery

## 2011-10-13 VITALS — BP 140/90 | HR 80 | Temp 97.2°F | Resp 20 | Ht 63.0 in | Wt 237.2 lb

## 2011-10-13 DIAGNOSIS — IMO0002 Reserved for concepts with insufficient information to code with codable children: Secondary | ICD-10-CM

## 2011-10-13 DIAGNOSIS — K429 Umbilical hernia without obstruction or gangrene: Secondary | ICD-10-CM

## 2011-10-13 NOTE — Progress Notes (Signed)
Subjective:     Patient ID: Amanda Baker, female   DOB: 02/05/1971, 41 y.o.   MRN: [id number]  HPI The patient is a 41 year old white female who is about 5 weeks out from an umbilical hernia repair with mesh. Her postoperative course was complicated by the development of a seroma that required opening and drainage. She has been coming to the office 3 times a week to get a dressing change. She denies any fevers. She is having some cloudy drainage.  Review of Systems     Objective:   Physical Exam On exam her incision is mostly completely healed. She has a small opening at the center of the incision that tracks about a centimeter deep. There is some soupy drainage from the area but no redness or cellulitis.    Assessment:     5 weeks status post umbilical hernia repair with mesh complicated by a seroma that had to be opened    Plan:     At this point I think the wound would be less severe she contains a dressing twice a day. I've talked her about this and she feels like she can do that. I would like her to shower daily without any dressings on at all. We will have her follow up next week with Dr. Gerrit Friends.

## 2011-10-13 NOTE — Patient Instructions (Signed)
Change dressing at home twice a day and shower daily

## 2011-10-14 ENCOUNTER — Ambulatory Visit (INDEPENDENT_AMBULATORY_CARE_PROVIDER_SITE_OTHER): Payer: 59 | Admitting: General Surgery

## 2011-10-14 ENCOUNTER — Telehealth (INDEPENDENT_AMBULATORY_CARE_PROVIDER_SITE_OTHER): Payer: Self-pay | Admitting: General Surgery

## 2011-10-14 VITALS — BP 118/80 | HR 72 | Temp 98.4°F | Resp 12 | Ht 63.0 in | Wt 236.0 lb

## 2011-10-14 DIAGNOSIS — Z4800 Encounter for change or removal of nonsurgical wound dressing: Secondary | ICD-10-CM

## 2011-10-14 DIAGNOSIS — IMO0001 Reserved for inherently not codable concepts without codable children: Secondary | ICD-10-CM

## 2011-10-14 NOTE — Telephone Encounter (Signed)
PT WAS SEEN IN OFFICE YESTERDAY BY DR. TOTH WHO REDRESSED WOUND WITH GAUZE. MS Maddix FEELS THAT PACKING IS TOO TIGHT AND FEELS LIKE PRESSURE IN WOUND AREA. SHE DOES NOT FEEL COMFORTABLE CHANGING PACKING. DR. Carolynne Edouard SAID IT WAS OK FOR PT TO COME TO OFFICE FOR DRESSING CHANGE AND EVALUATION OF SITE. I NOTIFIED MS Wellons OF PLAN TO COME TO OFFICE FOR DRESSING CHANGE THIS AM./GY

## 2011-10-14 NOTE — Progress Notes (Signed)
MS Willard ARRIVED TO HAVE DRESSING CHANGED. GAUZE PACKING REMOVED WITHOUT DIFFICULTY/ SMALL AMT PINK DRAINAGE ON END OF GAUZE. SKIN WITHOUT REDNESS OR SWELLING. CAVITY CLEANED WITH STERILE Q-TIP AND  REPACKED WITH 1/2" IODOFORM GAUZE PER PATIENT REQUEST.STERILE 4X4 PLACED OVER WOUND AND TAPED. PT HAS APPOINTMENT TO SEE DR. BLACKMAN TOMORROW/10-15-11/ SHE STATED AND WOUND AREA FELT MORE COMFORTABLE/GY/SHE DID NOT FEEL COMFORTABLE CHANGING HER OWN PACKING PER PT.

## 2011-10-15 ENCOUNTER — Ambulatory Visit (INDEPENDENT_AMBULATORY_CARE_PROVIDER_SITE_OTHER): Payer: 59 | Admitting: Surgery

## 2011-10-15 ENCOUNTER — Encounter (INDEPENDENT_AMBULATORY_CARE_PROVIDER_SITE_OTHER): Payer: Self-pay | Admitting: Surgery

## 2011-10-15 VITALS — BP 138/82 | HR 84 | Temp 97.4°F | Resp 14 | Ht 63.0 in | Wt 237.8 lb

## 2011-10-15 DIAGNOSIS — Z09 Encounter for follow-up examination after completed treatment for conditions other than malignant neoplasm: Secondary | ICD-10-CM

## 2011-10-15 NOTE — Progress Notes (Signed)
Subjective:     Patient ID: Amanda Baker, female   DOB: 04/04/1971, 41 y.o.   MRN: [id number]  HPI She is here today for a wound check and wound packing. Again she is status post local repair with mesh by Dr. Gerrit Friends. She is on Bactrim. She is having a little increased discomfort.  Review of Systems     Objective:   Physical Exam    On exam, the incision is healing well. There is still a deep cavity with serous fluid but no cellulitis and no odor Assessment:     Patient status post umbilical hernia. Mesh with open wound    Plan:     She will return to the urgent office in 2 days for wound repacking

## 2011-10-17 ENCOUNTER — Ambulatory Visit (INDEPENDENT_AMBULATORY_CARE_PROVIDER_SITE_OTHER): Payer: 59 | Admitting: Surgery

## 2011-10-17 ENCOUNTER — Encounter (INDEPENDENT_AMBULATORY_CARE_PROVIDER_SITE_OTHER): Payer: Self-pay | Admitting: Surgery

## 2011-10-17 VITALS — BP 110/76 | HR 72 | Temp 98.0°F | Resp 14 | Ht 63.0 in | Wt 237.6 lb

## 2011-10-17 DIAGNOSIS — Z09 Encounter for follow-up examination after completed treatment for conditions other than malignant neoplasm: Secondary | ICD-10-CM

## 2011-10-17 NOTE — Progress Notes (Signed)
Subjective:     Patient ID: Amanda Baker, female   DOB: [birth year], 41 y.o.   MRN: [id number]  HPI She is here for a dressing change. She has no complaints as I change of the other day. She has no fever  Review of Systems     Objective:   Physical Exam The wound is clean there is no erythema of the skin and no purulence. I repacked the wound with quarter inch plain packing gauze    Assessment:     Healing wound status post umbilical hernia repair with mesh    Plan:     Followup with Dr. Gerrit Friends Monday

## 2011-10-20 ENCOUNTER — Ambulatory Visit (INDEPENDENT_AMBULATORY_CARE_PROVIDER_SITE_OTHER): Payer: 59 | Admitting: Surgery

## 2011-10-20 ENCOUNTER — Encounter (INDEPENDENT_AMBULATORY_CARE_PROVIDER_SITE_OTHER): Payer: Self-pay | Admitting: Surgery

## 2011-10-20 VITALS — BP 128/80 | HR 82 | Temp 98.2°F | Resp 14 | Ht 63.0 in | Wt 238.3 lb

## 2011-10-20 DIAGNOSIS — L0291 Cutaneous abscess, unspecified: Secondary | ICD-10-CM

## 2011-10-20 DIAGNOSIS — IMO0002 Reserved for concepts with insufficient information to code with codable children: Secondary | ICD-10-CM

## 2011-10-20 DIAGNOSIS — K429 Umbilical hernia without obstruction or gangrene: Secondary | ICD-10-CM

## 2011-10-20 DIAGNOSIS — L0390 Cellulitis, unspecified: Secondary | ICD-10-CM

## 2011-10-20 NOTE — Progress Notes (Signed)
Visit Diagnoses: 1. Cellulitis   2. Seroma, postoperative   3. Umbilical hernia     HISTORY: The patient returns for wound check. She will complete her course of oral antibiotics tomorrow.  EXAM: Wound continues to heal. No sign of cellulitis. No fluctuance. With Valsalva there is no sign of recurrent hernia. There is a small 5 mm opening in the central portion of the incision. Packing is removed. Wound is probed to a depth of 4 cm with a Q-tip. There does not appear to be any undrained areas and the subcutaneous tissues. Plain gauze packing is replaced. Dry gauze dressings were placed.  IMPRESSION: Status post hernia repair at the umbilicus, postoperative seroma and cellulitis, resolving  PLAN: Patient will complete her course of oral antibiotics tomorrow. She will return in 2 days for dressing change and wound check.  Velora Heckler, MD, FACS General & Endocrine Surgery [facility]

## 2011-10-20 NOTE — Patient Instructions (Signed)
Wound care as instructed.  Maniah Nading M. Cyndie Woodbeck, MD, FACS [facility] Office: [phone]   

## 2011-10-22 ENCOUNTER — Encounter (INDEPENDENT_AMBULATORY_CARE_PROVIDER_SITE_OTHER): Payer: Self-pay | Admitting: Surgery

## 2011-10-22 ENCOUNTER — Ambulatory Visit (INDEPENDENT_AMBULATORY_CARE_PROVIDER_SITE_OTHER): Payer: 59 | Admitting: Surgery

## 2011-10-22 VITALS — BP 138/86 | HR 84 | Temp 99.3°F | Resp 24 | Ht 63.0 in | Wt 237.0 lb

## 2011-10-22 DIAGNOSIS — L0390 Cellulitis, unspecified: Secondary | ICD-10-CM

## 2011-10-22 DIAGNOSIS — L0291 Cutaneous abscess, unspecified: Secondary | ICD-10-CM

## 2011-10-22 DIAGNOSIS — K429 Umbilical hernia without obstruction or gangrene: Secondary | ICD-10-CM

## 2011-10-22 DIAGNOSIS — IMO0002 Reserved for concepts with insufficient information to code with codable children: Secondary | ICD-10-CM

## 2011-10-22 NOTE — Progress Notes (Signed)
Visit Diagnoses: 1. Seroma, postoperative   2. Cellulitis   3. Umbilical hernia     HISTORY: The patient returns for wound check. She completed her course of oral antibiotics yesterday.  EXAM: Wound has a small less than 1 cm opening in the central portion. Packing is removed. Wound is probed to a depth of 3.5 cm with a Q-tip. Plain gauze packing is replaced. Dry gauze dressings are placed.  IMPRESSION: Wounds at site of umbilical hernia repair, healing by secondary intention  PLAN: Patient will return in 48 hours for wound check and dressing change.  Velora Heckler, MD, FACS General & Endocrine Surgery [facility]

## 2011-10-22 NOTE — Patient Instructions (Signed)
Dressing changes as instructed.  Velora Heckler, MD, [facility] Office: [phone]

## 2011-10-24 ENCOUNTER — Ambulatory Visit (INDEPENDENT_AMBULATORY_CARE_PROVIDER_SITE_OTHER): Payer: 59 | Admitting: Surgery

## 2011-10-24 DIAGNOSIS — K429 Umbilical hernia without obstruction or gangrene: Secondary | ICD-10-CM

## 2011-10-24 DIAGNOSIS — L0390 Cellulitis, unspecified: Secondary | ICD-10-CM

## 2011-10-24 DIAGNOSIS — IMO0002 Reserved for concepts with insufficient information to code with codable children: Secondary | ICD-10-CM

## 2011-10-24 DIAGNOSIS — L0291 Cutaneous abscess, unspecified: Secondary | ICD-10-CM

## 2011-10-24 NOTE — Patient Instructions (Signed)
Continue dressing changes as usual.

## 2011-10-24 NOTE — Progress Notes (Signed)
Visit Diagnoses: 1. Cellulitis   2. Seroma, postoperative   3. Umbilical hernia     HISTORY: Patient returns for wound check and dressing change.  EXAM: Packing removed.  No purulence or cellulitis.  Dressing replaced.  IMPRESSION: Wound healing by secondary intention  PLAN: Continue wound care as instructed.  Return in 3 days for wound check and dressing change.  Velora Heckler, MD, FACS General & Endocrine Surgery [facility]

## 2011-10-27 ENCOUNTER — Encounter (INDEPENDENT_AMBULATORY_CARE_PROVIDER_SITE_OTHER): Payer: Self-pay | Admitting: Surgery

## 2011-10-27 ENCOUNTER — Ambulatory Visit (INDEPENDENT_AMBULATORY_CARE_PROVIDER_SITE_OTHER): Payer: 59 | Admitting: Surgery

## 2011-10-27 VITALS — BP 138/80 | HR 78 | Temp 97.7°F | Resp 24 | Ht 63.0 in | Wt 240.5 lb

## 2011-10-27 DIAGNOSIS — L0291 Cutaneous abscess, unspecified: Secondary | ICD-10-CM

## 2011-10-27 DIAGNOSIS — L0390 Cellulitis, unspecified: Secondary | ICD-10-CM

## 2011-10-27 DIAGNOSIS — K429 Umbilical hernia without obstruction or gangrene: Secondary | ICD-10-CM

## 2011-10-27 DIAGNOSIS — IMO0002 Reserved for concepts with insufficient information to code with codable children: Secondary | ICD-10-CM

## 2011-10-27 NOTE — Progress Notes (Signed)
Visit Diagnoses: 1. Cellulitis   2. Seroma, postoperative   3. Umbilical hernia     HISTORY: Patient returns for wound check.  EXAM: The wound continues to slowly close. No cellulitis. No fluctuance. Minimal drainage. Wound is treated with topical silver nitrate. A single strip of quarter inch packing is placed. Dry gauze dressings were placed.  IMPRESSION: Chronic umbilical wound following hernia repair, healing by secondary intention  PLAN: Patient has now been off antibiotics for several days. There is no sign of infection. Wound is quite small. I am going to ask her to remove the remaining packing in 48 hours and did not pack the wound again. We will observe this for one week. She will return at that time for evaluation and a decision regarding returning to work.  Velora Heckler, MD, FACS General & Endocrine Surgery [facility]

## 2011-10-27 NOTE — Patient Instructions (Signed)
Remove gauze packing strip on Wednesday and do not replace.  Wash daily with soap and water and keep covered with dry gauze.  Velora Heckler, MD, [facility] Office: [phone]

## 2011-11-03 ENCOUNTER — Encounter (INDEPENDENT_AMBULATORY_CARE_PROVIDER_SITE_OTHER): Payer: Self-pay | Admitting: Surgery

## 2011-11-03 ENCOUNTER — Ambulatory Visit (INDEPENDENT_AMBULATORY_CARE_PROVIDER_SITE_OTHER): Payer: 59 | Admitting: Surgery

## 2011-11-03 DIAGNOSIS — IMO0002 Reserved for concepts with insufficient information to code with codable children: Secondary | ICD-10-CM

## 2011-11-03 DIAGNOSIS — L0390 Cellulitis, unspecified: Secondary | ICD-10-CM

## 2011-11-03 DIAGNOSIS — K429 Umbilical hernia without obstruction or gangrene: Secondary | ICD-10-CM

## 2011-11-03 DIAGNOSIS — L0291 Cutaneous abscess, unspecified: Secondary | ICD-10-CM

## 2011-11-03 NOTE — Patient Instructions (Signed)
Change outside gauze dressing as needed.  Velora Heckler, MD, [facility] Office: [phone]

## 2011-11-03 NOTE — Progress Notes (Signed)
Visit Diagnoses: 1. Cellulitis   2. Seroma, postoperative   3. Umbilical hernia     HISTORY: Patient returns for wound check. Patient has been off antibiotics now for several weeks. Packing was removed last week.  EXAM: Incision shows a small 2 mm punctate opening in the midportion. There is a small amount of serous drainage. Q-tip and cannot be admitted to the wound. Tract was probed with a silver nitrate stick. It extends to a depth of 2 cm. Tract is quite narrow. Silver nitrate was applied.  IMPRESSION: Healing surgical wound following umbilical hernia repair complicated by cellulitis and seroma formation  PLAN: Slow progress with umbilical hernia wound. Patient will continue to cover with dry gauze and monitor the drainage. So far there is no sign of recurrent infection. She will return in one week for wound check.  Velora Heckler, MD, FACS General & Endocrine Surgery [facility]

## 2011-11-11 ENCOUNTER — Encounter (INDEPENDENT_AMBULATORY_CARE_PROVIDER_SITE_OTHER): Payer: Self-pay | Admitting: Surgery

## 2011-11-11 ENCOUNTER — Ambulatory Visit (INDEPENDENT_AMBULATORY_CARE_PROVIDER_SITE_OTHER): Payer: 59 | Admitting: Surgery

## 2011-11-11 VITALS — BP 136/84 | HR 98 | Temp 99.2°F | Ht 63.0 in | Wt 242.4 lb

## 2011-11-11 DIAGNOSIS — IMO0002 Reserved for concepts with insufficient information to code with codable children: Secondary | ICD-10-CM

## 2011-11-11 DIAGNOSIS — L0291 Cutaneous abscess, unspecified: Secondary | ICD-10-CM

## 2011-11-11 DIAGNOSIS — L0390 Cellulitis, unspecified: Secondary | ICD-10-CM

## 2011-11-11 DIAGNOSIS — K429 Umbilical hernia without obstruction or gangrene: Secondary | ICD-10-CM

## 2011-11-11 NOTE — Patient Instructions (Signed)
Cover with dry gauze.  Shower daily.  Velora Heckler, MD, [facility] Office: [phone]

## 2011-11-11 NOTE — Progress Notes (Signed)
Visit Diagnoses: 1. Cellulitis   2. Seroma, postoperative   3. Umbilical hernia     HISTORY: Patient returns for wound check. There has been no packing for one week. There is a small amount of yellow tinged serous drainage on the gauze.  EXAM: Wound is punctate. I am able to enter a small cavity with a silver nitrate stick. There is further thin clear serous fluid. There is no purulent drainage. There is no sign of cellulitis. Dry gauze dressing is applied.  IMPRESSION: Chronic draining wound following umbilical hernia repair with mesh  PLAN: Patient will continue to cleanse the wound daily. She will continue to place dry gauze over the wound to track the amount of drainage. She will return in 10 days for wound check.  Velora Heckler, MD, FACS General & Endocrine Surgery [facility]

## 2011-11-20 ENCOUNTER — Encounter (INDEPENDENT_AMBULATORY_CARE_PROVIDER_SITE_OTHER): Payer: Self-pay | Admitting: Surgery

## 2011-11-20 ENCOUNTER — Ambulatory Visit (INDEPENDENT_AMBULATORY_CARE_PROVIDER_SITE_OTHER): Payer: 59 | Admitting: Surgery

## 2011-11-20 VITALS — BP 126/87 | HR 96 | Temp 98.5°F | Ht 63.0 in | Wt 240.8 lb

## 2011-11-20 DIAGNOSIS — K429 Umbilical hernia without obstruction or gangrene: Secondary | ICD-10-CM

## 2011-11-20 DIAGNOSIS — IMO0002 Reserved for concepts with insufficient information to code with codable children: Secondary | ICD-10-CM

## 2011-11-20 NOTE — Patient Instructions (Signed)
Dry gauze dressing changes as directed.  Velora Heckler, MD, [facility] Office: [phone]

## 2011-11-20 NOTE — Progress Notes (Signed)
Visit Diagnoses: 1. Umbilical hernia   2. Seroma, postoperative     HISTORY: Patient returns for wound check. She is still experiencing a small amount of thin yellow drainage from the midline of the umbilical incision. She has no significant pain. She has had no fever. She is anxious to return to work next week.  EXAM: Incision beneath the umbilicus is well healed except for a punctate wound in the midportion of the incision. This is probed with a silver nitrate stick to a depth of 4 cm. Cavity appears quite tight. There was no additional drainage. There is no sign of cellulitis.  IMPRESSION: Status post umbilical hernia repair with mesh complicated by postoperative seroma and cellulitis  PLAN: Patient will continue dry gauze dressing changes as needed. She will return to work next week. She will return for wound check in 2 weeks.  Velora Heckler, MD, FACS General & Endocrine Surgery [facility]

## 2011-11-25 ENCOUNTER — Other Ambulatory Visit (INDEPENDENT_AMBULATORY_CARE_PROVIDER_SITE_OTHER): Payer: Self-pay | Admitting: Surgery

## 2011-11-25 ENCOUNTER — Encounter (INDEPENDENT_AMBULATORY_CARE_PROVIDER_SITE_OTHER): Payer: Self-pay | Admitting: Surgery

## 2011-11-25 ENCOUNTER — Ambulatory Visit (INDEPENDENT_AMBULATORY_CARE_PROVIDER_SITE_OTHER): Payer: 59 | Admitting: Surgery

## 2011-11-25 VITALS — BP 120/83 | HR 77 | Temp 98.3°F | Resp 16 | Ht 63.0 in | Wt 243.8 lb

## 2011-11-25 DIAGNOSIS — L089 Local infection of the skin and subcutaneous tissue, unspecified: Secondary | ICD-10-CM

## 2011-11-25 DIAGNOSIS — IMO0002 Reserved for concepts with insufficient information to code with codable children: Secondary | ICD-10-CM

## 2011-11-25 MED ORDER — SULFAMETHOXAZOLE-TRIMETHOPRIM 800-160 MG PO TABS: 2.0000 | ORAL_TABLET | Freq: Two times a day (BID) | ORAL | Status: DC

## 2011-11-25 NOTE — Progress Notes (Signed)
CC More redness at incision  HPI: S/P repair of UH about 11 weeks ago with issues with seroma and apparently some infection. Now with more pain at incision and increased redness. No fever  Exam: VS: BP 120/83  Pulse 77  Temp(Src) 98.3 F (36.8 C) (Temporal)  Resp 16  Ht 5\' 3"  (1.6 m)  Wt 243 lb 12.8 oz (110.587 kg)  BMI 43.19 kg/m2  General: Alert, NAD Abd: Soft and benign. Some drainage from the mid portion of the wound and the scar is red. No obvious fluctuance. I put 1cc local and probed and there is a cavity with serous fluid  Imp: Seroma, possibly starting to get infected  Plan: Left a wick in, got a C&S and will start bactrim; she will come for nurse visit Friday to get wick changed.

## 2011-11-25 NOTE — Patient Instructions (Signed)
You may change the outer dressing after you shower. Leave the wick in. Have our nurse change the dressing and the wick removed on Friday.

## 2011-11-28 ENCOUNTER — Ambulatory Visit (INDEPENDENT_AMBULATORY_CARE_PROVIDER_SITE_OTHER): Payer: 59

## 2011-11-28 DIAGNOSIS — IMO0001 Reserved for inherently not codable concepts without codable children: Secondary | ICD-10-CM

## 2011-11-28 DIAGNOSIS — Z4800 Encounter for change or removal of nonsurgical wound dressing: Secondary | ICD-10-CM

## 2011-11-28 LAB — WOUND CULTURE: Gram Stain: NONE SEEN

## 2011-11-28 NOTE — Progress Notes (Signed)
The patient came in for a dressing change.  She has pink purulent drainage that i expressed.  I repacked with 1/4in iodoform and dry gauze.  She is on antibiotics and her culture was negative.  I spoke to Dr Jamey Ripa who saw her last and he said have her come in Monday for wound ck.  I made an appt

## 2011-12-01 ENCOUNTER — Ambulatory Visit (INDEPENDENT_AMBULATORY_CARE_PROVIDER_SITE_OTHER): Payer: 59 | Admitting: Surgery

## 2011-12-01 ENCOUNTER — Encounter (INDEPENDENT_AMBULATORY_CARE_PROVIDER_SITE_OTHER): Payer: Self-pay | Admitting: Surgery

## 2011-12-01 VITALS — BP 130/74 | HR 80 | Temp 98.4°F | Resp 18 | Wt 242.0 lb

## 2011-12-01 DIAGNOSIS — IMO0002 Reserved for concepts with insufficient information to code with codable children: Secondary | ICD-10-CM

## 2011-12-01 NOTE — Progress Notes (Signed)
General Surgery [facility]  Visit Diagnoses: 1. Seroma, postoperative     HISTORY: Patient returns for wound check. She has a chronic draining umbilical wound following umbilical hernia repair with mesh. She saw my partner last week and the wound was opened and drained. It was packed with iodoform gauze packing. Cultures were taken. There is no growth on the culture. She has completed a brief course of Bactrim.  EXAM: Wound is clear. No cellulitis. Wound is probed with a Q-tip to a depth of 5 cm. The tract is quite narrow. Tract is cleansed with hydrogen peroxide and repacked with quarter inch iodoform gauze packing.  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 We are going to aspirate home health nursing to assist with twice-daily dressing changes using Q-tips and hydrogen peroxide to cleanse the wound followed by insertion of quarter-inch plain gauze packing and a dry gauze dressing. If this is not successful, then I think the patient will require return to the operating room for debridement under anesthesia and open packing with healing by secondary intention.  Velora Heckler, MD, FACS General & Endocrine Surgery [facility]

## 2011-12-01 NOTE — Patient Instructions (Signed)
See progress note for wound care instructions.  Velora Heckler, MD, [facility] Office: [phone]

## 2011-12-02 ENCOUNTER — Ambulatory Visit (INDEPENDENT_AMBULATORY_CARE_PROVIDER_SITE_OTHER): Payer: 59

## 2011-12-02 VITALS — BP 130/78 | HR 88 | Temp 98.3°F | Resp 18 | Ht 62.0 in | Wt 241.4 lb

## 2011-12-02 DIAGNOSIS — IMO0001 Reserved for inherently not codable concepts without codable children: Secondary | ICD-10-CM

## 2011-12-02 DIAGNOSIS — Z4800 Encounter for change or removal of nonsurgical wound dressing: Secondary | ICD-10-CM

## 2011-12-02 NOTE — Progress Notes (Signed)
Pt called this morning stating since she was not homebound the [facility] would not be covered by [facility] per [facility]. Pt in to office today for wound care and to teach husband how to do wound care. Husband showed how to do wound care and dsg. He states he is comfortable in doing this twice a day. He will monitor color,odor and depth of wound . Pt to keep Thursday appt with Dr Gerrit Friends since [facility] will not be coming to pt at home. Pt to monitor temp and call if fever occurs. Pt to call with any concerns.

## 2011-12-04 ENCOUNTER — Encounter (INDEPENDENT_AMBULATORY_CARE_PROVIDER_SITE_OTHER): Payer: Self-pay

## 2011-12-04 ENCOUNTER — Other Ambulatory Visit (INDEPENDENT_AMBULATORY_CARE_PROVIDER_SITE_OTHER): Payer: Self-pay

## 2011-12-04 ENCOUNTER — Encounter (INDEPENDENT_AMBULATORY_CARE_PROVIDER_SITE_OTHER): Payer: Self-pay | Admitting: Surgery

## 2011-12-04 ENCOUNTER — Ambulatory Visit (INDEPENDENT_AMBULATORY_CARE_PROVIDER_SITE_OTHER): Payer: 59 | Admitting: Surgery

## 2011-12-04 VITALS — BP 136/82 | HR 70 | Temp 97.8°F | Resp 14 | Ht 63.0 in | Wt 243.1 lb

## 2011-12-04 DIAGNOSIS — IMO0002 Reserved for concepts with insufficient information to code with codable children: Secondary | ICD-10-CM

## 2011-12-04 NOTE — Progress Notes (Signed)
General Surgery [facility]  Visit Diagnoses: 1. Seroma, postoperative     HISTORY: Patient returns for wound check.Husband is assisting with twice-daily dressing changes.  EXAM: The wound is clear. No cellulitis. Packing is removed and wound is cleansed with a Q-tip and hydrogen peroxide.Packing is replaced.  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 I will assess the wound early next week and make a decision regarding return to the operating room at that time.  Velora Heckler, MD, FACS General & Endocrine Surgery [facility]

## 2011-12-04 NOTE — Patient Instructions (Signed)
Continue with twice daily dressing changes as instructed.  Velora Heckler, MD, [facility] Office: [phone]

## 2011-12-08 ENCOUNTER — Ambulatory Visit (INDEPENDENT_AMBULATORY_CARE_PROVIDER_SITE_OTHER): Payer: 59 | Admitting: Surgery

## 2011-12-08 ENCOUNTER — Encounter (INDEPENDENT_AMBULATORY_CARE_PROVIDER_SITE_OTHER): Payer: Self-pay

## 2011-12-08 ENCOUNTER — Encounter (INDEPENDENT_AMBULATORY_CARE_PROVIDER_SITE_OTHER): Payer: Self-pay | Admitting: Surgery

## 2011-12-08 VITALS — BP 148/96 | HR 80 | Temp 99.7°F | Resp 16 | Ht 63.0 in | Wt 242.8 lb

## 2011-12-08 DIAGNOSIS — IMO0002 Reserved for concepts with insufficient information to code with codable children: Secondary | ICD-10-CM

## 2011-12-08 DIAGNOSIS — K429 Umbilical hernia without obstruction or gangrene: Secondary | ICD-10-CM

## 2011-12-08 NOTE — Patient Instructions (Signed)
Continue dressing changes as instructed.  Carlen Fils M. Louella Medaglia, MD, FACS [facility] Office: [phone]   

## 2011-12-08 NOTE — Progress Notes (Signed)
General Surgery [facility]  Visit Diagnoses: 1. Seroma, postoperative   2. Umbilical hernia     HISTORY: Patient returns for wound check. The wound does not appear to be making any significant progress. She has had no further complications, but the wound is failing to heal.  EXAM: A small opening in the central portion of the incision just below the umbilicus. Packing is removed. Wound is probed to a depth of 6 cm with a Q-tip and hydrogen peroxide. No sign of cellulitis. Wound is packed with quarter inch plain gauze packing and covered with dry gauze dressing.  IMPRESSION: Status post umbilical hernia repair with mesh, postoperative seroma and cellulitis, nonhealing surgical wound  PLAN: The patient and I discussed further options for management. At this point she is ready to proceed with wound exploration, debridement, and open packing. I think this is the right choice. We will make arrangements for surgery as an outpatient procedure later this week.  The risks and benefits of the procedure have been discussed at length with the patient.  The patient understands the proposed procedure, potential alternative treatments, and the course of recovery to be expected.  All of the patient's questions have been answered at this time.  The patient wishes to proceed with surgery.  Velora Heckler, MD, FACS General & Endocrine Surgery [facility]

## 2011-12-09 ENCOUNTER — Encounter (HOSPITAL_COMMUNITY): Payer: Self-pay | Admitting: Pharmacy Technician

## 2011-12-10 ENCOUNTER — Encounter (HOSPITAL_COMMUNITY): Payer: Self-pay

## 2011-12-10 ENCOUNTER — Encounter (HOSPITAL_COMMUNITY)
Admission: RE | Admit: 2011-12-10 | Discharge: 2011-12-10 | Disposition: A | Discharge status: Home / Self Care | Payer: 59 | Source: Ambulatory Visit | Attending: Surgery | Admitting: Surgery

## 2011-12-10 ENCOUNTER — Ambulatory Visit (HOSPITAL_COMMUNITY)
Admission: RE | Admit: 2011-12-10 | Discharge: 2011-12-10 | Disposition: A | Discharge status: Home / Self Care | Payer: 59 | Source: Ambulatory Visit | Attending: Surgery | Admitting: Surgery

## 2011-12-10 DIAGNOSIS — Z01812 Encounter for preprocedural laboratory examination: Secondary | ICD-10-CM | POA: Insufficient documentation

## 2011-12-10 DIAGNOSIS — I10 Essential (primary) hypertension: Secondary | ICD-10-CM | POA: Insufficient documentation

## 2011-12-10 DIAGNOSIS — Z01818 Encounter for other preprocedural examination: Secondary | ICD-10-CM | POA: Insufficient documentation

## 2011-12-10 DIAGNOSIS — Z01810 Encounter for preprocedural cardiovascular examination: Secondary | ICD-10-CM | POA: Insufficient documentation

## 2011-12-10 HISTORY — DX: Palpitations: R00.2

## 2011-12-10 HISTORY — DX: Malignant (primary) neoplasm, unspecified: C80.1

## 2011-12-10 HISTORY — DX: Nausea with vomiting, unspecified: R11.2

## 2011-12-10 HISTORY — DX: Nausea with vomiting, unspecified: Z98.890

## 2011-12-10 LAB — SURGICAL PCR SCREEN: Staphylococcus aureus: NEGATIVE

## 2011-12-10 LAB — BASIC METABOLIC PANEL
CO2: 23 mEq/L (ref 19–32)
Chloride: 101 mEq/L (ref 96–112)
Glucose, Bld: 90 mg/dL (ref 70–99)
Potassium: 4.2 mEq/L (ref 3.5–5.1)
Sodium: 135 mEq/L (ref 135–145)

## 2011-12-10 LAB — CBC
Hemoglobin: 13.5 g/dL (ref 12.0–15.0)
MCH: 29.9 pg (ref 26.0–34.0)
Platelets: 407 10*3/uL — ABNORMAL HIGH (ref 150–400)
RBC: 4.51 MIL/uL (ref 3.87–5.11)
WBC: 6.7 10*3/uL (ref 4.0–10.5)

## 2011-12-10 LAB — HCG, SERUM, QUALITATIVE: Preg, Serum: NEGATIVE

## 2011-12-10 NOTE — Patient Instructions (Addendum)
YOUR SURGERY IS SCHEDULED ON: Thursday  6/27  AT 8:45 AM  REPORT TO [facility] STAY [facility] AT: 6:45 AM      PHONE # FOR SHORT STAY IS [phone]  DO NOT EAT OR DRINK ANYTHING AFTER MIDNIGHT THE NIGHT BEFORE YOUR SURGERY.  YOU MAY BRUSH YOUR TEETH, RINSE OUT YOUR MOUTH--BUT NO WATER, NO FOOD, NO CHEWING GUM, NO MINTS, NO CANDIES, NO CHEWING TOBACCO.  PLEASE TAKE THE FOLLOWING MEDICATIONS THE AM OF YOUR SURGERY WITH A FEW SIPS OF WATER:  WELLBUTRIN, PROZAC AND YOUR BETA BLOCKER METOPROLOL    IF YOU USE INHALERS--USE YOUR INHALERS THE AM OF YOUR SURGERY AND BRING INHALERS TO THE HOSPITAL -TAKE TO SURGERY.    IF YOU ARE DIABETIC:  DO NOT TAKE ANY DIABETIC MEDICATIONS THE AM OF YOUR SURGERY.  IF YOU TAKE INSULIN IN THE EVENINGS--PLEASE ONLY TAKE 1/2 NORMAL EVENING DOSE THE NIGHT BEFORE YOUR SURGERY.  NO INSULIN THE AM OF YOUR SURGERY.  IF YOU HAVE SLEEP APNEA AND USE CPAP OR BIPAP--PLEASE BRING THE MASK --NOT THE MACHINE-NOT THE TUBING   -JUST THE MASK. DO NOT BRING VALUABLES, MONEY, CREDIT CARDS.  CONTACT LENS, DENTURES / PARTIALS, GLASSES SHOULD NOT BE WORN TO SURGERY AND IN MOST CASES-HEARING AIDS WILL NEED TO BE REMOVED.  BRING YOUR GLASSES CASE, ANY EQUIPMENT NEEDED FOR YOUR CONTACT LENS. FOR PATIENTS ADMITTED TO THE HOSPITAL--CHECK OUT TIME THE DAY OF DISCHARGE IS 11:00 AM.  ALL INPATIENT ROOMS ARE PRIVATE - WITH BATHROOM, TELEPHONE, TELEVISION AND WIFI INTERNET. IF YOU ARE BEING DISCHARGED THE SAME DAY OF YOUR SURGERY--YOU CAN NOT DRIVE YOURSELF HOME--AND SHOULD NOT GO HOME ALONE BY TAXI OR BUS.  NO DRIVING OR OPERATING MACHINERY FOR 24 HOURS FOLLOWING ANESTHESIA / PAIN MEDICATIONS.                            SPECIAL INSTRUCTIONS:  CHLORHEXIDINE SOAP SHOWER (other brand names are Betasept and Hibiclens ) PLEASE SHOWER WITH CHLORHEXIDINE THE NIGHT BEFORE YOUR SURGERY AND THE AM OF YOUR SURGERY. DO NOT USE CHLORHEXIDINE ON YOUR FACE OR PRIVATE AREAS--YOU MAY USE YOUR NORMAL SOAP THOSE  AREAS AND YOUR NORMAL SHAMPOO.  WOMEN SHOULD AVOID SHAVING UNDER ARMS AND SHAVING LEGS 48 HOURS BEFORE USING CHLORHEXIDINE TO AVOID SKIN IRRITATION.  DO NOT USE IF ALLERGIC TO CHLORHEXIDINE.  PLEASE READ OVER ANY  FACT SHEETS THAT YOU WERE GIVEN: MRSA INFORMATION

## 2011-12-10 NOTE — Pre-Procedure Instructions (Signed)
CBC, BMET, SERUM PREGNANCY, EKG AND CXR WERE DONE TODAY - PREOP - AT [facility] -AS PER ANESTHESIOLOGIST'S GUIDELINE. PREOP INSTRUCTIONS DISCUSSED WITH PT USING TEACH BACK METHOD.  PT PLANS TO COVER HER ABDOMINAL WOUND WITH PLASTIC BEFORE SHOWERING WITH BETASEPT SOAP--SHE CURRENTLY SHOWERS-THEN CHANGES HER DRESSING COVERING HER ABDOMINAL WOUND.  SHE UNDERSTANDS NOT TO USE THE BETASEPT ON HER OPEN WOUND.

## 2011-12-11 ENCOUNTER — Other Ambulatory Visit (INDEPENDENT_AMBULATORY_CARE_PROVIDER_SITE_OTHER): Payer: Self-pay | Admitting: Surgery

## 2011-12-11 ENCOUNTER — Encounter (HOSPITAL_COMMUNITY): Payer: Self-pay | Admitting: Anesthesiology

## 2011-12-11 ENCOUNTER — Encounter (HOSPITAL_COMMUNITY)
Admission: RE | Disposition: A | Discharge status: Home / Self Care | Payer: Self-pay | Source: Ambulatory Visit | Attending: Surgery

## 2011-12-11 ENCOUNTER — Encounter (HOSPITAL_COMMUNITY): Payer: Self-pay | Admitting: *Deleted

## 2011-12-11 ENCOUNTER — Ambulatory Visit (HOSPITAL_COMMUNITY)
Admission: RE | Admit: 2011-12-11 | Discharge: 2011-12-11 | Disposition: A | Discharge status: Home / Self Care | Payer: 59 | Source: Ambulatory Visit | Attending: Surgery | Admitting: Surgery

## 2011-12-11 ENCOUNTER — Ambulatory Visit (HOSPITAL_COMMUNITY): Payer: 59 | Admitting: Anesthesiology

## 2011-12-11 DIAGNOSIS — IMO0002 Reserved for concepts with insufficient information to code with codable children: Secondary | ICD-10-CM

## 2011-12-11 DIAGNOSIS — Z01812 Encounter for preprocedural laboratory examination: Secondary | ICD-10-CM | POA: Insufficient documentation

## 2011-12-11 DIAGNOSIS — Y838 Other surgical procedures as the cause of abnormal reaction of the patient, or of later complication, without mention of misadventure at the time of the procedure: Secondary | ICD-10-CM | POA: Insufficient documentation

## 2011-12-11 DIAGNOSIS — R5381 Other malaise: Secondary | ICD-10-CM | POA: Insufficient documentation

## 2011-12-11 DIAGNOSIS — T8189XA Other complications of procedures, not elsewhere classified, initial encounter: Secondary | ICD-10-CM | POA: Insufficient documentation

## 2011-12-11 DIAGNOSIS — E7800 Pure hypercholesterolemia, unspecified: Secondary | ICD-10-CM | POA: Insufficient documentation

## 2011-12-11 DIAGNOSIS — K429 Umbilical hernia without obstruction or gangrene: Secondary | ICD-10-CM

## 2011-12-11 DIAGNOSIS — I10 Essential (primary) hypertension: Secondary | ICD-10-CM | POA: Insufficient documentation

## 2011-12-11 DIAGNOSIS — Z79899 Other long term (current) drug therapy: Secondary | ICD-10-CM | POA: Insufficient documentation

## 2011-12-11 DIAGNOSIS — S31109A Unspecified open wound of abdominal wall, unspecified quadrant without penetration into peritoneal cavity, initial encounter: Secondary | ICD-10-CM

## 2011-12-11 HISTORY — PX: WOUND DEBRIDEMENT: SHX247

## 2011-12-11 SURGERY — DEBRIDEMENT, WOUND, ABDOMEN
Anesthesia: General | Wound class: Contaminated

## 2011-12-11 MED ORDER — HYDROMORPHONE HCL PF 1 MG/ML IJ SOLN
INTRAMUSCULAR | Status: AC
  Filled 2011-12-11: qty 1

## 2011-12-11 MED ORDER — DEXAMETHASONE SODIUM PHOSPHATE 10 MG/ML IJ SOLN
INTRAMUSCULAR | Status: DC | PRN
  Administered 2011-12-11: 10 mg via INTRAVENOUS

## 2011-12-11 MED ORDER — PROPOFOL 10 MG/ML IV BOLUS
INTRAVENOUS | Status: DC | PRN
  Administered 2011-12-11: 150 mg via INTRAVENOUS

## 2011-12-11 MED ORDER — 0.9 % SODIUM CHLORIDE (POUR BTL) OPTIME
TOPICAL | Status: DC | PRN
  Administered 2011-12-11: 1000 mL

## 2011-12-11 MED ORDER — MIDAZOLAM HCL 5 MG/5ML IJ SOLN
INTRAMUSCULAR | Status: DC | PRN
  Administered 2011-12-11: 2 mg via INTRAVENOUS

## 2011-12-11 MED ORDER — CIPROFLOXACIN IN D5W 400 MG/200ML IV SOLN
INTRAVENOUS | Status: AC
  Filled 2011-12-11: qty 200

## 2011-12-11 MED ORDER — SCOPOLAMINE 1 MG/3DAYS TD PT72
MEDICATED_PATCH | TRANSDERMAL | Status: DC | PRN
  Administered 2011-12-11: 1 via TRANSDERMAL

## 2011-12-11 MED ORDER — ONDANSETRON HCL 4 MG/2ML IJ SOLN
INTRAMUSCULAR | Status: DC | PRN
  Administered 2011-12-11: 4 mg via INTRAVENOUS

## 2011-12-11 MED ORDER — HYDROMORPHONE HCL PF 1 MG/ML IJ SOLN
0.2500 mg | INTRAMUSCULAR | Status: DC | PRN
  Administered 2011-12-11 (×3): 0.5 mg via INTRAVENOUS

## 2011-12-11 MED ORDER — ACETAMINOPHEN 10 MG/ML IV SOLN
INTRAVENOUS | Status: AC
  Filled 2011-12-11: qty 100

## 2011-12-11 MED ORDER — DIPHENHYDRAMINE HCL 50 MG/ML IJ SOLN
INTRAMUSCULAR | Status: AC
  Filled 2011-12-11: qty 1

## 2011-12-11 MED ORDER — LACTATED RINGERS IV SOLN: INTRAVENOUS | Status: DC

## 2011-12-11 MED ORDER — ACETAMINOPHEN 10 MG/ML IV SOLN
INTRAVENOUS | Status: DC | PRN
  Administered 2011-12-11: 1000 mg via INTRAVENOUS

## 2011-12-11 MED ORDER — PROMETHAZINE HCL 25 MG/ML IJ SOLN: 6.2500 mg | INTRAMUSCULAR | Status: DC | PRN

## 2011-12-11 MED ORDER — BUPIVACAINE HCL 0.5 % IJ SOLN
INTRAMUSCULAR | Status: DC | PRN
  Administered 2011-12-11: 20 mL

## 2011-12-11 MED ORDER — SCOPOLAMINE 1 MG/3DAYS TD PT72
MEDICATED_PATCH | TRANSDERMAL | Status: AC
  Filled 2011-12-11: qty 1

## 2011-12-11 MED ORDER — LACTATED RINGERS IV SOLN
INTRAVENOUS | Status: DC | PRN
  Administered 2011-12-11 (×2): via INTRAVENOUS

## 2011-12-11 MED ORDER — HYDROCODONE-ACETAMINOPHEN 5-325 MG PO TABS: 1.0000 | ORAL_TABLET | ORAL | Status: DC | PRN

## 2011-12-11 MED ORDER — BUPIVACAINE HCL 0.5 % IJ SOLN
INTRAMUSCULAR | Status: AC
  Filled 2011-12-11: qty 1

## 2011-12-11 MED ORDER — DIPHENHYDRAMINE HCL 50 MG/ML IJ SOLN
25.0000 mg | Freq: Once | INTRAMUSCULAR | Status: AC
  Administered 2011-12-11: 25 mg via INTRAVENOUS

## 2011-12-11 MED ORDER — FENTANYL CITRATE 0.05 MG/ML IJ SOLN
INTRAMUSCULAR | Status: DC | PRN
  Administered 2011-12-11: 100 ug via INTRAVENOUS

## 2011-12-11 MED ORDER — CIPROFLOXACIN IN D5W 400 MG/200ML IV SOLN
400.0000 mg | INTRAVENOUS | Status: AC
  Administered 2011-12-11: 400 mg via INTRAVENOUS

## 2011-12-11 SURGICAL SUPPLY — 48 items
APPLICATOR COTTON TIP 6IN STRL (MISCELLANEOUS) IMPLANT
BLADE EXTENDED COATED 6.5IN (ELECTRODE) IMPLANT
BLADE HEX COATED 2.75 (ELECTRODE) ×2 IMPLANT
BLADE SURG SZ10 CARB STEEL (BLADE) IMPLANT
CANISTER SUCTION 2500CC (MISCELLANEOUS) ×2 IMPLANT
CLIP TI LARGE 6 (CLIP) IMPLANT
CLOTH BEACON ORANGE TIMEOUT ST (SAFETY) ×2 IMPLANT
COVER MAYO STAND STRL (DRAPES) IMPLANT
DRAPE LAPAROSCOPIC ABDOMINAL (DRAPES) ×2 IMPLANT
DRAPE LG THREE QUARTER DISP (DRAPES) IMPLANT
DRAPE WARM FLUID 44X44 (DRAPE) IMPLANT
DRSG PAD ABDOMINAL 8X10 ST (GAUZE/BANDAGES/DRESSINGS) ×2 IMPLANT
ELECT REM PT RETURN 9FT ADLT (ELECTROSURGICAL) ×2
ELECTRODE REM PT RTRN 9FT ADLT (ELECTROSURGICAL) ×1 IMPLANT
EVACUATOR DRAINAGE 10X20 100CC (DRAIN) IMPLANT
EVACUATOR SILICONE 100CC (DRAIN)
GLOVE BIOGEL PI IND STRL 7.0 (GLOVE) IMPLANT
GLOVE BIOGEL PI INDICATOR 7.0 (GLOVE)
GLOVE SURG ORTHO 8.0 STRL STRW (GLOVE) ×2 IMPLANT
GOWN STRL NON-REIN LRG LVL3 (GOWN DISPOSABLE) ×2 IMPLANT
GOWN STRL REIN XL XLG (GOWN DISPOSABLE) ×4 IMPLANT
KIT BASIN OR (CUSTOM PROCEDURE TRAY) ×2 IMPLANT
LEGGING LITHOTOMY PAIR STRL (DRAPES) IMPLANT
LIGASURE IMPACT 36 18CM CVD LR (INSTRUMENTS) IMPLANT
NS IRRIG 1000ML POUR BTL (IV SOLUTION) ×2 IMPLANT
PACK GENERAL/GYN (CUSTOM PROCEDURE TRAY) ×2 IMPLANT
SCALPEL HARMONIC ACE (MISCELLANEOUS) IMPLANT
SEALER TISSUE X1 CVD JAW (INSTRUMENTS) IMPLANT
SPONGE GAUZE 4X4 12PLY (GAUZE/BANDAGES/DRESSINGS) ×2 IMPLANT
SPONGE LAP 18X18 X RAY DECT (DISPOSABLE) IMPLANT
STAPLER VISISTAT 35W (STAPLE) IMPLANT
SUCTION POOLE TIP (SUCTIONS) IMPLANT
SUT ETHILON 3 0 PS 1 (SUTURE) IMPLANT
SUT NOV 1 T60/GS (SUTURE) IMPLANT
SUT NOVA NAB DX-16 0-1 5-0 T12 (SUTURE) IMPLANT
SUT NOVA T20/GS 25 (SUTURE) IMPLANT
SUT SILK 2 0 (SUTURE)
SUT SILK 2 0 SH CR/8 (SUTURE) IMPLANT
SUT SILK 2 0SH CR/8 30 (SUTURE) IMPLANT
SUT SILK 2-0 18XBRD TIE 12 (SUTURE) IMPLANT
SUT SILK 2-0 30XBRD TIE 12 (SUTURE) IMPLANT
SUT SILK 3 0 (SUTURE)
SUT SILK 3 0 SH CR/8 (SUTURE) IMPLANT
SUT SILK 3-0 18XBRD TIE 12 (SUTURE) IMPLANT
SUT VIC AB 4-0 SH 18 (SUTURE) IMPLANT
TOWEL OR 17X26 10 PK STRL BLUE (TOWEL DISPOSABLE) ×4 IMPLANT
TRAY FOLEY CATH 14FRSI W/METER (CATHETERS) IMPLANT
YANKAUER SUCT BULB TIP NO VENT (SUCTIONS) ×2 IMPLANT

## 2011-12-11 NOTE — Addendum Note (Signed)
Addendum  created 12/11/11 1034 by Einar Pheasant, MD   Modules edited:Orders

## 2011-12-11 NOTE — Transfer of Care (Signed)
Immediate Anesthesia Transfer of Care Note  Patient: Amanda Baker  Procedure(s) Performed: Procedure(s) (LRB): DEBRIDEMENT ABDOMINAL WOUND (N/A)  Patient Location: PACU  Anesthesia Type: General  Level of Consciousness: awake, sedated and patient cooperative  Airway & Oxygen Therapy: Patient Spontanous Breathing and Patient connected to face mask oxygen  Post-op Assessment: Report given to PACU RN and Post -op Vital signs reviewed and stable  Post vital signs: Reviewed  Complications: No apparent anesthesia complications

## 2011-12-11 NOTE — Anesthesia Postprocedure Evaluation (Signed)
Anesthesia Post Note  Patient: Amanda Baker  Procedure(s) Performed: Procedure(s) (LRB): DEBRIDEMENT ABDOMINAL WOUND (N/A)  Anesthesia type: General  Patient location: PACU  Post pain: Pain level controlled  Post assessment: Post-op Vital signs reviewed  Last Vitals:  Filed Vitals:   12/11/11 0936  BP:   Pulse:   Temp: 36.3 C  Resp:     Post vital signs: Reviewed  Level of consciousness: sedated  Complications: No apparent anesthesia complications

## 2011-12-11 NOTE — Brief Op Note (Signed)
12/11/2011  9:35 AM  PATIENT:  Amanda Baker  41 y.o. female  PRE-OPERATIVE DIAGNOSIS:  non-healing abdominal wound  POST-OPERATIVE DIAGNOSIS:  non-healing abdominal wound  PROCEDURE:  Procedure(s) (LRB): DEBRIDEMENT ABDOMINAL WOUND (N/A)  SURGEON:  Surgeon(s) and Role:    * Velora Heckler, MD - Primary  ANESTHESIA:   general  EBL:  Total I/O In: 1200 [I.V.:1200] Out: -   BLOOD ADMINISTERED:none  DRAINS: none   LOCAL MEDICATIONS USED:  MARCAINE     SPECIMEN:  No Specimen  DISPOSITION OF SPECIMEN:  N/A  COUNTS:  YES  TOURNIQUET:  * No tourniquets in log *  DICTATION: .Other Dictation: Dictation Number [id number]  PLAN OF CARE: Discharge to home after PACU  PATIENT DISPOSITION:  PACU - hemodynamically stable.   Delay start of Pharmacological VTE agent (>24hrs) due to surgical blood loss or risk of bleeding: yes  Velora Heckler, MD, [facility] Office: [phone]

## 2011-12-11 NOTE — Progress Notes (Signed)
Pt c/o itching all over her body; face flushed; no rash seen; Dr. Rica Mast, anesthes. Called, order rec'd and med given

## 2011-12-11 NOTE — H&P (Signed)
Amanda Baker is an 41 y.o. female.    Chief Complaint: non-healing abdominal wound  HPI: patient underwent umbilical hernia repair with mesh in March 2013.  Developed seroma and cellulitis post op.  Open wound packed and healing by secondary intention.  Cultures negative for MRSA.  Now for wound exploration and debridement for failure to progress.  Past Medical History  Diagnosis Date  . Hypertension   . Depression   . Bladder disorder     INCONTINENCE AND FREQUENCY  . Pain in joint, site unspecified   . Acute thyroiditis 2004    PROBLEM WITH THYROIDITIS RESOLVED  . Other and unspecified manifestations of thiamine deficiency   . Anxiety state, unspecified   . Insomnia, unspecified   . Pure hypercholesterolemia   . Cancer     SKIN CANCERS  . Heart palpitations     PT ON METOPROLOL  . PONV (postoperative nausea and vomiting)     SEVERE!!   NO N&V AFTER UMBILICAL HERNIA REPAIR AT [facility]    Past Surgical History  Procedure Date  . Gallbladder surgery   . Cholecystectomy 1995  . Hernia repair 09/05/11    UMBILICAL  . 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 Tonsillectomy 1989  . Wisdom teeth extract 1996   . Vaginal wall nodules surgically removed 1998     Family History  Problem Relation Age of Onset  . Heart failure Mother   . Heart disease Mother    Social History:  reports that she has never smoked. She has never used smokeless tobacco. She reports that she does not drink alcohol or use illicit drugs.  Allergies:  Allergies  Allergen Reactions  . Erythromycin     SEVERE ABDOMINAL CRAMPING  . Codeine     hallunication  . Lisinopril     COUGHING  . Penicillins     Throat swelling    Medications Prior to Admission  Medication Sig Dispense Refill  . atorvastatin (LIPITOR) 20 MG tablet Take 20 mg by mouth daily.      Marland Kitchen buPROPion (WELLBUTRIN XL) 300 MG 24 hr tablet Take 300 mg by mouth daily with breakfast.       . FLUoxetine (PROZAC)  20 MG tablet Take 20 mg by mouth daily.      . metoprolol (TOPROL-XL) 100 MG 24 hr tablet Take 100 mg by mouth daily with breakfast.       . Norgestrel-Ethinyl Estradiol (CRYSELLE-28 PO) Take 1 tablet by mouth daily.         Results for orders placed during the hospital encounter of 12/10/11 (from the past 48 hour(s))  SURGICAL PCR SCREEN     Status: Normal   Collection Time   12/10/11  9:00 AM      Component Value Range Comment   MRSA, PCR NEGATIVE  NEGATIVE    Staphylococcus aureus NEGATIVE  NEGATIVE   CBC     Status: Abnormal   Collection Time   12/10/11  9:50 AM      Component Value Range Comment   WBC 6.7  4.0 - 10.5 K/uL    RBC 4.51  3.87 - 5.11 MIL/uL    Hemoglobin 13.5  12.0 - 15.0 g/dL    HCT 40.9  81.1 - 91.4 %    MCV 91.4  78.0 - 100.0 fL    MCH 29.9  26.0 - 34.0 pg    MCHC 32.8  30.0 - 36.0 g/dL    RDW 13.8  11.5 - 15.5 %    Platelets 407 (*) 150 - 400 K/uL   BASIC METABOLIC PANEL     Status: Normal   Collection Time   12/10/11  9:50 AM      Component Value Range Comment   Sodium 135  135 - 145 mEq/L    Potassium 4.2  3.5 - 5.1 mEq/L    Chloride 101  96 - 112 mEq/L    CO2 23  19 - 32 mEq/L    Glucose, Bld 90  70 - 99 mg/dL    BUN 11  6 - 23 mg/dL    Creatinine, Ser 1.61  0.50 - 1.10 mg/dL    Calcium 9.6  8.4 - 09.6 mg/dL    GFR calc non Af Amer >90  >90 mL/min    GFR calc Af Amer >90  >90 mL/min   HCG, SERUM, QUALITATIVE     Status: Normal   Collection Time   12/10/11  9:50 AM      Component Value Range Comment   Preg, Serum NEGATIVE  NEGATIVE    Dg Chest 2 View  12/10/2011  *RADIOLOGY REPORT*  Clinical Data: Preoperative evaluation.  History of hypertension.  CHEST - 2 VIEW  Comparison: 05/13/2010.  Findings: Cardiac silhouette is normal size and shape.  Mediastinal and hilar contours appear stable.  No pulmonary infiltrates or nodules were evident. No pleural abnormality is evident. Bones appear average for age.  IMPRESSION: No acute or active cardiopulmonary  or pleural abnormalities are evident.  Original Report Authenticated By: Crawford Givens, M.D.    Review of Systems  Constitutional: Positive for malaise/fatigue. Negative for fever, chills, weight loss and diaphoresis.  HENT: Negative.   Eyes: Negative.   Respiratory: Negative.   Cardiovascular: Negative.   Gastrointestinal: Negative.   Genitourinary: Negative.   Musculoskeletal: Negative.   Skin: Negative.   Neurological: Negative.  Negative for weakness.  Endo/Heme/Allergies: Negative.   Psychiatric/Behavioral: Negative.     Blood pressure 107/73, pulse 88, temperature 98.3 F (36.8 C), temperature source Oral, resp. rate 18, last menstrual period 12/11/2011, SpO2 99.00%. Physical Exam  Constitutional: She is oriented to person, place, and time. She appears well-developed and well-nourished. No distress.  HENT:  Head: Normocephalic and atraumatic.  Right Ear: External ear normal.  Left Ear: External ear normal.  Nose: Nose normal.  Mouth/Throat: Oropharynx is clear and moist.  Eyes: Conjunctivae and EOM are normal. Pupils are equal, round, and reactive to light. No scleral icterus.  Neck: Normal range of motion. Neck supple. No tracheal deviation present. No thyromegaly present.  Cardiovascular: Normal rate, regular rhythm, normal heart sounds and intact distal pulses.   No murmur heard. Respiratory: Effort normal and breath sounds normal. No respiratory distress. She has no wheezes.  GI: Soft. Bowel sounds are normal.  Musculoskeletal: Normal range of motion. Tenderness: small open wound below umbilicus with packing gauze.  Lymphadenopathy:    She has no cervical adenopathy.  Neurological: She is alert and oriented to person, place, and time. She has normal reflexes.  Skin: Skin is warm and dry.  Psychiatric: She has a normal mood and affect. Her behavior is normal. Judgment and thought content normal.     Assessment/Plan Non-healing abdominal wound  Plan wound exploration  and debridement with open packing  The risks and benefits of the procedure have been discussed at length with the patient.  The patient understands the proposed procedure, potential alternative treatments, and the course of recovery to be expected.  All of the patient's questions have been answered at this time.  The patient wishes to proceed with surgery.  Velora Heckler, MD, [facility] Office: [phone]    Kalden Wanke Judie Petit 12/11/2011, 8:36 AM

## 2011-12-11 NOTE — Anesthesia Preprocedure Evaluation (Addendum)
Anesthesia Evaluation  Patient identified by MRN, date of birth, ID band Patient awake    Reviewed: Allergy & Precautions, H&P , NPO status , Patient's Chart, lab work & pertinent test results  History of Anesthesia Complications (+) PONV  Airway Mallampati: III TM Distance: >3 FB Neck ROM: Full    Dental  (+) Teeth Intact and Dental Advisory Given   Pulmonary neg pulmonary ROS,  breath sounds clear to auscultation  Pulmonary exam normal       Cardiovascular hypertension, Pt. on medications and Pt. on home beta blockers + dysrhythmias Supra Ventricular Tachycardia Rhythm:Regular Rate:Normal     Neuro/Psych Anxiety Depression negative neurological ROS  negative psych ROS   GI/Hepatic negative GI ROS, Neg liver ROS,   Endo/Other  Morbid obesity  Renal/GU negative Renal ROS  negative genitourinary   Musculoskeletal negative musculoskeletal ROS (+)   Abdominal (+) + obese,   Peds  Hematology negative hematology ROS (+)   Anesthesia Other Findings   Reproductive/Obstetrics negative OB ROS                          Anesthesia Physical Anesthesia Plan  ASA: III  Anesthesia Plan: General   Post-op Pain Management:    Induction: Intravenous  Airway Management Planned: LMA  Additional Equipment:   Intra-op Plan:   Post-operative Plan: Extubation in OR  Informed Consent: I have reviewed the patients History and Physical, chart, labs and discussed the procedure including the risks, benefits and alternatives for the proposed anesthesia with the patient or authorized representative who has indicated his/her understanding and acceptance.   Dental advisory given  Plan Discussed with: CRNA  Anesthesia Plan Comments:        Anesthesia Quick Evaluation

## 2011-12-11 NOTE — Discharge Instructions (Signed)
Hernia, Surgical Repair A hernia occurs when an internal organ pushes out through a weak spot in the belly (abdominal) wall muscles. Hernias commonly occur in the groin and around the navel. Hernias often can be pushed back into place (reduced). Most hernias tend to get worse over time. Problems occur when abdominal contents get stuck in the opening (incarcerated hernia). The blood supply gets cut off (strangulated hernia). This is an emergency and needs surgery. Otherwise, hernia repair can be an elective procedure. This means you can schedule this at your convenience when an emergency is not present. Because complications can occur, if you decide to repair the hernia, it is best to do it soon. When it becomes an emergency procedure, there is increased risk of complications after surgery. CAUSES   Heavy lifting.   Obesity.   Prolonged coughing.   Straining to move your bowels.   Hernias can also occur through a cut (incision) by a surgeonafter an abdominal operation.  HOME CARE INSTRUCTIONS Before the repair:  Bed rest is not required. You may continue your normal activities, but avoid heavy lifting (more than 10 pounds) or straining. Cough gently. If you are a smoker, it is best to stop. Even the best hernia repair can break down with the continual strain of coughing.   Do not wear anything tight over your hernia. Do not try to keep it in with an outside bandage or truss. These can damage abdominal contents if they are trapped in the hernia sac.   Eat a normal diet. Avoid constipation. Straining over long periods of time to have a bowel movement will increase hernia size. It also can breakdown repairs. If you cannot do this with diet alone, laxatives or stool softeners may be used.  PRIOR TO SURGERY, SEEK IMMEDIATE MEDICAL CARE IF: You have problems (symptoms) of a trapped (incarcerated) hernia. Symptoms include:  An oral temperature above 102 F (38.9 C) develops, or as your caregiver  suggests.   Increasing abdominal pain.   Feeling sick to your stomach(nausea) and vomiting.   You stop passing gas or stool.   The hernia is stuck outside the abdomen, looks discolored, feels hard, or is tender.   You have any changes in your bowel habits or in the hernia that is unusual for you.  LET YOUR CAREGIVERS KNOW ABOUT THE FOLLOWING:  Allergies.   Medications taken including herbs, eye drops, over the counter medications, and creams.   Use of steroids (by mouth or creams).   Family or personal history of problems with anesthetics or Novocaine.   Possibility of pregnancy, if this applies.   Personal history of blood clots (thrombophlebitis).   Family or personal history of bleeding or blood problems.   Previous surgery.   Other health problems.  BEFORE THE PROCEDURE You should be present 1 hour prior to your procedure, or as directed by your caregiver.  AFTER THE PROCEDURE After surgery, you will be taken to the recovery area. A nurse will watch and check your progress there. Once you are awake, stable, and taking fluids well, you will be allowed to go home as long as there are no problems. Once home, an ice pack (wrapped in a light towel) applied to your operative site may help with discomfort. It may also keep the swelling down. Do not lift anything heavier than 10 pounds (4.55 kilograms). Take showers not baths. Do not drive while taking narcotics. Follow instructions as suggested by your caregiver.  SEEK IMMEDIATE MEDICAL CARE IF: After   surgery:  There is redness, swelling, or increasing pain in the wound.   There is pus coming from the wound.   There is drainage from a wound lasting longer than 1 day.   An unexplained oral temperature above 102 F (38.9 C) develops.   You notice a foul smell coming from the wound or dressing.   There is a breaking open of a wound (edged not staying together) after the sutures have been removed.   You notice increasing  pain in the shoulders (shoulder strap areas).   You develop dizzy episodes or fainting while standing.   You develop persistent nausea or vomiting.   You develop a rash.   You have difficulty breathing.   You develop any reaction or side effects to medications given.  MAKE SURE YOU:   Understand these instructions.   Will watch your condition.   Will get help right away if you are not doing well or get worse.  Document Released: 11/26/2000 Document Revised: 05/22/2011 Document Reviewed: 10/19/2007 [employer] Patient Information 2012 [employer]. 

## 2011-12-12 ENCOUNTER — Encounter (INDEPENDENT_AMBULATORY_CARE_PROVIDER_SITE_OTHER): Payer: Self-pay | Admitting: General Surgery

## 2011-12-12 ENCOUNTER — Encounter (HOSPITAL_COMMUNITY): Payer: Self-pay | Admitting: Surgery

## 2011-12-12 ENCOUNTER — Encounter (INDEPENDENT_AMBULATORY_CARE_PROVIDER_SITE_OTHER): Payer: 59 | Admitting: Surgery

## 2011-12-12 ENCOUNTER — Telehealth (INDEPENDENT_AMBULATORY_CARE_PROVIDER_SITE_OTHER): Payer: Self-pay

## 2011-12-12 NOTE — Telephone Encounter (Signed)
The patient called regarding her home health.  She has not heard from them.  The referral was made and per [facility] she should give it until noon and call us back if she hasn't heard by then.  She also wants to know how long she will be out of work.  Dr Gerrit Friends did not know until he did the surgery.  She would like Bernie to call and discuss this because she will need a note faxed.

## 2011-12-12 NOTE — Op Note (Signed)
NAMETOYE, ROUILLARD             ACCOUNT NO.:  [id number]  MEDICAL RECORD NO.:  [id number]  LOCATION:  [facility]                         FACILITY:  [facility]  PHYSICIAN:  Velora Heckler, MD      DATE OF BIRTH:  1971/02/09  DATE OF PROCEDURE:  12/11/2011                               OPERATIVE REPORT   PREOPERATIVE DIAGNOSIS:  Nonhealing abdominal wound.  POSTOPERATIVE DIAGNOSIS:  Nonhealing abdominal wound.  PROCEDURE:  Wound exploration and debridement with open packing.  SURGEON:  Velora Heckler, MD, FACS       ANESTHESIA:  General per Jenelle Mages. Fortune, M.D.  ESTIMATED BLOOD LOSS:  Minimal.  PREPARATION:  Betadine.  COMPLICATIONS:  None.  INDICATIONS:  The patient is a 41 year old white female, who underwent repair of umbilical hernia in March 2013.  Postoperatively, she developed a seroma and cellulitis.  Wound was eventually opened, drained, and packed open.  It has been healing by secondary intention. However, the wound has not completely healed and continues to intermittently drain through the skin incision.  The patient now comes to the operating room for wound exploration and debridement.  BODY OF REPORT:  The procedure was done in OR #10 at the [facility]  The patient was brought to the operating room, placed in supine position on the operating room table.  Following administration of general anesthesia, the patient was prepped and draped in the usual strict aseptic fashion.  After ascertaining that, an adequate level of anesthesia had been achieved, the midline wound was probed with a hemostat.  The subcutaneous tract is quite narrow. Incision was made along the previous scar.  Dissection was carried into the subcutaneous tissues and the tract was opened.  The tract goes directly posterior down to the level of the fascia.  The fistulous tract was debrided out of the subcutaneous tissues and cauterized with the electrocautery.  There was a small  pinpoint opening in the deep scar tissue, which goes into a space just above what was presumed to be the subfascial mesh.  There was granulation tissue which was debrided. There was a small amount of purulent drainage which was evacuated. There was no exposed mesh.  There was no visible suture material. Granulation tissue was debrided and cauterized with the electrocautery. The deep space was widely opened to provide access for dressing changes. Wound was irrigated with warm saline and good hemostasis was achieved. Wound was packed with 4 x 4 gauze, sponges, saturated with Betadine solution.  Four sponges were left in place in the wound.  Dry dressings were placed on the abdominal wall.  The fascia and the skin were anesthetized with local Marcaine anesthetic.  Dry dressings were applied.  The patient was awakened from anesthesia and brought to the recovery room. The patient tolerated the procedure well.  Velora Heckler, MD, [facility] Office: [phone]   TMG/MEDQ  D:  12/11/2011  T:  12/11/2011  Job:  [id number]

## 2011-12-13 ENCOUNTER — Telehealth (INDEPENDENT_AMBULATORY_CARE_PROVIDER_SITE_OTHER): Payer: Self-pay | Admitting: Surgery

## 2011-12-13 NOTE — Telephone Encounter (Signed)
Pt called with concerns of feeling occasionally lightheaded & room occasionally spinning.  No N/V.  Tolerating PO well.  Urinating fine.  Mild soreness "but I'm fine, I'm gonna live"  She had some debridement of her umbilical wound two days ago by Dr. Gerrit Friends.  She noted she was walking OK.  Constipated but that is her baseline.  Not needing much pain medicines.  Energy level otherwise okay.  She does not know how her blood pressures running.  She does take metoprolol.  I recommend that she stick to liquids.  Try laxative help her bowels move.  Do not push through the lightheadedness or dizziness.  Noted some people can have a little mild fogginess with the anesthesia but that should be wearing off soon.  If it is getting better over the next day or so, that is encouraging.  If it persists or worsens, she may need to run by her primary care physician to see if her blood pressure medicines need to be held or something more aggressive.  Is severely worsens, she may need a good emergency room since it is now the weekend.  I offered that we could see her Monday or Tuesday if needed.  She felt reassured.  She sounds more curious than in dire straits.

## 2011-12-22 ENCOUNTER — Encounter (INDEPENDENT_AMBULATORY_CARE_PROVIDER_SITE_OTHER): Payer: Self-pay | Admitting: Surgery

## 2011-12-22 ENCOUNTER — Ambulatory Visit (INDEPENDENT_AMBULATORY_CARE_PROVIDER_SITE_OTHER): Payer: 59 | Admitting: Surgery

## 2011-12-22 VITALS — BP 128/88 | HR 76 | Temp 97.2°F | Resp 16 | Ht 63.0 in | Wt 243.4 lb

## 2011-12-22 DIAGNOSIS — T07XXXA Unspecified multiple injuries, initial encounter: Secondary | ICD-10-CM

## 2011-12-22 DIAGNOSIS — T148XXD Other injury of unspecified body region, subsequent encounter: Secondary | ICD-10-CM

## 2011-12-22 DIAGNOSIS — IMO0002 Reserved for concepts with insufficient information to code with codable children: Secondary | ICD-10-CM

## 2011-12-22 NOTE — Progress Notes (Signed)
General Surgery [facility]  Visit Diagnoses: 1. Seroma, postoperative   2. Wound healing, delayed     HISTORY: The patient returns having undergone operative wound exploration and debridement with open packing. They're doing twice daily dressing changes wet-to-dry with normal saline moistened gauze packing.  EXAM: Packing is removed the wound. Wound is clean and granulating. However the packing does not reached the bottom of the wound cavity. There is some necrotic debris and fluid at the base of the wound which is cleansed today. Wound is then packed with 3 - 4 x 4 gauze sponges wet-to-dry.  IMPRESSION: Chronic abdominal wound with delayed healing, healing by secondary intention with twice-daily dressing changes  PLAN: Twice-daily dressing changes to continue. Patient will return to work next week. She will return to see me in the office in one week for wound check and dressing change.  Velora Heckler, MD, FACS General & Endocrine Surgery [facility]

## 2011-12-22 NOTE — Patient Instructions (Signed)
Continue twice daily dressing changes as instructed.  Velora Heckler, MD, [facility] Office: [phone]

## 2011-12-29 ENCOUNTER — Encounter (INDEPENDENT_AMBULATORY_CARE_PROVIDER_SITE_OTHER): Payer: Self-pay | Admitting: Surgery

## 2011-12-29 ENCOUNTER — Ambulatory Visit (INDEPENDENT_AMBULATORY_CARE_PROVIDER_SITE_OTHER): Payer: 59 | Admitting: Surgery

## 2011-12-29 VITALS — BP 114/68 | HR 70 | Temp 97.8°F | Resp 16 | Ht 63.0 in | Wt 242.5 lb

## 2011-12-29 DIAGNOSIS — T148XXD Other injury of unspecified body region, subsequent encounter: Secondary | ICD-10-CM

## 2011-12-29 DIAGNOSIS — T07XXXA Unspecified multiple injuries, initial encounter: Secondary | ICD-10-CM

## 2011-12-29 NOTE — Patient Instructions (Signed)
Continue twice daily dressing changes.  Cleanse wound with hydrogen peroxide rinse with each dressing change.

## 2011-12-29 NOTE — Progress Notes (Signed)
General Surgery [facility]  Visit Diagnoses: 1. Wound healing, delayed     HISTORY: Patient continues twice daily dressing changes wet to dry with normal saline. She returns today for wound check.  EXAM: The wound is clean and granulating. No odor. No cellulitis. Wound is dressed with normal saline moistened with saline wet to dry.  IMPRESSION: Chronic open abdominal wound healing by secondary intention  PLAN: We'll continue twice daily wet to dry dressing changes. I have asked the patient and her husband to rinse the wound with hydrogen peroxide with each dressing change. She will return in 10 days for wound check.  Velora Heckler, MD, FACS General & Endocrine Surgery [facility]

## 2011-12-30 ENCOUNTER — Telehealth (INDEPENDENT_AMBULATORY_CARE_PROVIDER_SITE_OTHER): Payer: Self-pay | Admitting: General Surgery

## 2011-12-30 NOTE — Telephone Encounter (Signed)
Verbal order to extend home health for an additional 1 week given to Zella Ball, RN @ [facility] [phone])

## 2012-01-07 ENCOUNTER — Encounter (INDEPENDENT_AMBULATORY_CARE_PROVIDER_SITE_OTHER): Payer: Self-pay | Admitting: Surgery

## 2012-01-07 ENCOUNTER — Ambulatory Visit (INDEPENDENT_AMBULATORY_CARE_PROVIDER_SITE_OTHER): Payer: 59 | Admitting: Surgery

## 2012-01-07 VITALS — BP 130/76 | HR 78 | Temp 96.0°F | Resp 18 | Ht 63.0 in | Wt 241.4 lb

## 2012-01-07 DIAGNOSIS — T07XXXA Unspecified multiple injuries, initial encounter: Secondary | ICD-10-CM

## 2012-01-07 DIAGNOSIS — T148XXD Other injury of unspecified body region, subsequent encounter: Secondary | ICD-10-CM

## 2012-01-07 NOTE — Patient Instructions (Signed)
Continue twice daily dressing changes.  May go swimming - change dressing after swimming.

## 2012-01-07 NOTE — Progress Notes (Signed)
General Surgery [facility]  Visit Diagnoses: 1. Wound healing, delayed     HISTORY: Patient returns for wound check. They continued to do twice-daily dressing changes. They are rinsing with hydrogen peroxide. Patient has noted increased brownish colored drainage. Denies fevers.  EXAM: Abdominal wound at the level of the umbilicus is clean and granulating. Packing is removed. No significant drainage. No odor. No sign of cellulitis. Dressing is changed with normal saline moistened gauze.  IMPRESSION: Chronic abdominal wound following hernia repair, healing by secondary intention  PLAN: Patient will continue twice-daily dressing changes. She will return in 2 weeks for wound check  Velora Heckler, MD, FACS General & Endocrine Surgery [facility]

## 2012-01-19 ENCOUNTER — Ambulatory Visit (INDEPENDENT_AMBULATORY_CARE_PROVIDER_SITE_OTHER): Payer: 59 | Admitting: Surgery

## 2012-01-19 ENCOUNTER — Encounter (INDEPENDENT_AMBULATORY_CARE_PROVIDER_SITE_OTHER): Payer: Self-pay | Admitting: Surgery

## 2012-01-19 VITALS — BP 126/70 | HR 70 | Temp 97.4°F | Resp 16 | Ht 63.0 in | Wt 246.2 lb

## 2012-01-19 DIAGNOSIS — T148XXD Other injury of unspecified body region, subsequent encounter: Secondary | ICD-10-CM

## 2012-01-19 DIAGNOSIS — T07XXXA Unspecified multiple injuries, initial encounter: Secondary | ICD-10-CM

## 2012-01-19 NOTE — Patient Instructions (Signed)
Continue twice daily dressing changes as instructed.  Use one 4x4 gauze for packing.  Velora Heckler, MD, [facility] Office: [phone]

## 2012-01-19 NOTE — Progress Notes (Signed)
General Surgery [facility]  Visit Diagnoses: 1. Wound healing, delayed     HISTORY: Patient returns for wound check. They're doing twice-daily dressing changes with wet-to-dry normal saline moistened gauze and cleansing the wound with hydrogen peroxide.  EXAM: Packing is removed from the wound. There is a moderate amount of light pain and drainage on the gauze. The wound itself is clean and granulating throughout. It is moderately friable. Wound is probed with Q-tips and hydrogen peroxide. There does not appear to be any exposed mesh or suture material. There is no sign of infection. Wound is packed with one 4 x 4 gauze sponge moistened with normal saline and covered with dry gauze dressings.  IMPRESSION: Chronic abdominal wound at site of umbilical hernia repair, healing by secondary intention  PLAN: Patient and her husband will continue twice-daily dressing changes. I've asked him to use one 4 x 4 gauze sponge for the packing. They will continue to cleanse with hydrogen peroxide. They will continue to do dressings twice daily. Patient will return in 2 weeks for wound check.  Velora Heckler, MD, FACS General & Endocrine Surgery [facility]

## 2012-01-22 ENCOUNTER — Ambulatory Visit (INDEPENDENT_AMBULATORY_CARE_PROVIDER_SITE_OTHER): Payer: 59 | Admitting: Surgery

## 2012-01-22 ENCOUNTER — Encounter (INDEPENDENT_AMBULATORY_CARE_PROVIDER_SITE_OTHER): Payer: Self-pay | Admitting: Surgery

## 2012-01-22 VITALS — BP 144/82 | HR 84 | Temp 99.3°F | Resp 16 | Ht 63.0 in | Wt 246.0 lb

## 2012-01-22 DIAGNOSIS — T148XXD Other injury of unspecified body region, subsequent encounter: Secondary | ICD-10-CM

## 2012-01-22 DIAGNOSIS — T07XXXA Unspecified multiple injuries, initial encounter: Secondary | ICD-10-CM

## 2012-01-22 NOTE — Progress Notes (Signed)
General Surgery [facility]  Visit Diagnoses: 1. Wound healing, delayed     HISTORY: Patient returns for wound check. She has experienced a change in the quality of drainage. She is having intermittent discomfort at the site of the wound.  EXAM: Wound is clean and granulating. Wound is explored with Q-tips and cleansed with hydrogen peroxide. Good visualization of the wound cavity shows granulation tissue throughout. There is no exposed mesh. There is no exposed suture material. There is no sign of purulent drainage. There is no sign of cellulitis.  IMPRESSION: Chronic open abdominal wound healing by secondary intention  PLAN: Patient is reassured. Dressing is changed in the office today. They will continue wet to dry dressing changes. She will return in 5 days for wound check.  Velora Heckler, MD, FACS General & Endocrine Surgery [facility]

## 2012-01-22 NOTE — Patient Instructions (Signed)
Continue twice daily dressing changes.  tmg 

## 2012-01-27 ENCOUNTER — Ambulatory Visit (INDEPENDENT_AMBULATORY_CARE_PROVIDER_SITE_OTHER): Payer: 59 | Admitting: Surgery

## 2012-01-27 ENCOUNTER — Encounter (INDEPENDENT_AMBULATORY_CARE_PROVIDER_SITE_OTHER): Payer: Self-pay | Admitting: Surgery

## 2012-01-27 VITALS — BP 132/84 | HR 92 | Temp 98.4°F | Ht 63.0 in | Wt 245.6 lb

## 2012-01-27 DIAGNOSIS — T148XXD Other injury of unspecified body region, subsequent encounter: Secondary | ICD-10-CM

## 2012-01-27 DIAGNOSIS — T07XXXA Unspecified multiple injuries, initial encounter: Secondary | ICD-10-CM

## 2012-01-27 NOTE — Progress Notes (Signed)
General Surgery [facility]  Visit Diagnoses: 1. Wound healing, delayed     HISTORY: The patient returns for wound check. They continued to do dressing changes twice daily. She notes some burning sensation with use of hydrogen peroxide.  EXAM: Wound is clean and granulating. Bottom is slowly filling in and the wound is not as deep as it was previously. There is no exposed mesh or suture material. There is no odor. There is no sign of infection. Wound is packed with normal saline moistened gauze. I am able to use approximately 80% of a 4 x 4 sponge.  IMPRESSION: Chronic abdominal wound healing by secondary intention  PLAN: Patient will continue twice daily dressing changes. I have asked him to use hydrogen peroxide once daily. They will not be able to use a full gauze sponge and will begin trimming the sponges to a smaller size as needed. She will return for a wound check in 2 weeks.  Velora Heckler, MD, FACS General & Endocrine Surgery [facility]

## 2012-01-27 NOTE — Patient Instructions (Signed)
Use hydrogen peroxide with dressing change once daily please.  Velora Heckler, MD, [facility] Office: [phone]

## 2012-02-11 ENCOUNTER — Ambulatory Visit (INDEPENDENT_AMBULATORY_CARE_PROVIDER_SITE_OTHER): Payer: 59 | Admitting: Surgery

## 2012-02-11 ENCOUNTER — Encounter (INDEPENDENT_AMBULATORY_CARE_PROVIDER_SITE_OTHER): Payer: Self-pay | Admitting: Surgery

## 2012-02-11 VITALS — BP 122/82 | HR 80 | Temp 98.0°F | Resp 14 | Ht 63.0 in | Wt 246.0 lb

## 2012-02-11 DIAGNOSIS — T07XXXA Unspecified multiple injuries, initial encounter: Secondary | ICD-10-CM

## 2012-02-11 DIAGNOSIS — T148XXD Other injury of unspecified body region, subsequent encounter: Secondary | ICD-10-CM

## 2012-02-11 NOTE — Patient Instructions (Signed)
Continue dressing changes as instructed.  tmg 

## 2012-02-11 NOTE — Progress Notes (Signed)
General Surgery [facility]  Visit Diagnoses: 1. Wound healing, delayed     HISTORY: Patient returns for wound check. She and her husband continued to do twice-daily wet-to-dry dressing changes.  EXAM: Wound is clean and granulating throughout. There is no significant tenderness. It is significantly more shallow than at her last visit. Wet to dry dressing is changed. A small amount of dried blood on the edge of the wound is debrided and is cauterized with silver nitrate.  IMPRESSION: Chronic abdominal wound following hernia repair, healing by secondary intention  PLAN: Patient will continue twice-daily wet-to-dry dressing changes. I will see her back in followup in 2 weeks.  Velora Heckler, MD, FACS General & Endocrine Surgery [facility]

## 2012-03-03 ENCOUNTER — Encounter (INDEPENDENT_AMBULATORY_CARE_PROVIDER_SITE_OTHER): Payer: Self-pay | Admitting: Surgery

## 2012-03-03 ENCOUNTER — Ambulatory Visit (INDEPENDENT_AMBULATORY_CARE_PROVIDER_SITE_OTHER): Payer: 59 | Admitting: Surgery

## 2012-03-03 VITALS — BP 128/76 | HR 74 | Temp 97.6°F | Resp 16 | Ht 63.0 in | Wt 246.4 lb

## 2012-03-03 DIAGNOSIS — T07XXXA Unspecified multiple injuries, initial encounter: Secondary | ICD-10-CM

## 2012-03-03 DIAGNOSIS — T148XXD Other injury of unspecified body region, subsequent encounter: Secondary | ICD-10-CM

## 2012-03-03 NOTE — Progress Notes (Signed)
General Surgery [facility]  Visit Diagnoses: 1. Wound healing, delayed     HISTORY: Patient returns for wound check. Drainage has significantly decreased. Wound has become much more shallow.  EXAM: The wound now measures less than 1 cm in diameter and less than 1 cm in depth. It is clean and granulating. It is explored with a Q-tip and hydrogen peroxide and there are no undrained cavities.  IMPRESSION: Healing surgical wound without further complication  PLAN: Patient will discontinue wet-to-dry dressings. I've asked her to shower once daily and cleanse the wound once daily with Q-tip and hydrogen peroxide. I've asked them to apply antibiotic ointment to the wound twice daily. She will return to see me in 4 weeks for final wound check.  Velora Heckler, MD, FACS General & Endocrine Surgery [facility]

## 2012-03-03 NOTE — Patient Instructions (Signed)
No more wet dressings!  Apply antibiotic ointment (Neosporin or Triple Antibiotic Ointment) to wound twice daily.  Shower once daily.  Cleanse wound with Qtip and hydrogen peroxide once daily.  Velora Heckler, MD, [facility] Office: [phone]

## 2012-03-08 ENCOUNTER — Telehealth (INDEPENDENT_AMBULATORY_CARE_PROVIDER_SITE_OTHER): Payer: Self-pay

## 2012-03-08 NOTE — Telephone Encounter (Signed)
Patient called in concerned with some discomfort she's been experiencing over the weekend.  Patient denies any redness, swelling, drainage or fevers.  She wasn't sure if this is to be expected or if she needs to be seen for further evaluation.  Ms. Latterell ask to give her a call at work [phone].

## 2012-03-08 NOTE — Telephone Encounter (Signed)
LMOM returning call to pt. Per Dr Gerrit Friends pt to continue wd care and monitor for swelling,fever,foul drainage or redness. If these symptoms occur pt will need appt asap.

## 2012-03-19 ENCOUNTER — Telehealth (INDEPENDENT_AMBULATORY_CARE_PROVIDER_SITE_OTHER): Payer: Self-pay

## 2012-03-19 NOTE — Telephone Encounter (Signed)
The patient called about her wound which is pretty much closed now for about a week.  For a few days it has felt hard under the incision and it's a little puffy at one end.  She wants to make sure it's not recollecting fluid.  I thought it may be some scar tissue forming but I paged Dr Gerrit Friends.  He said don't do anything different but watch it over the weekend.  If it stays the same don't worry about it but if it gets worse, call Monday and ask for Arline Asp and he will work her in.  He is here all day Monday.  I notified the pt.

## 2012-03-25 ENCOUNTER — Telehealth (INDEPENDENT_AMBULATORY_CARE_PROVIDER_SITE_OTHER): Payer: Self-pay

## 2012-03-25 NOTE — Telephone Encounter (Signed)
Contacted the patient and advised her to call her PCP, Dr. Laurann Montana, per Dr. Cathrine Muster instructions.  She was told to keep her 03/31/12 f/u appt with Dr Gerrit Friends as well.

## 2012-03-25 NOTE — Telephone Encounter (Signed)
Pt calling today stating her wound has completely healed.  There is no longer any hardness under her incision, but she is still retaining fluid.  Her hands, feet and ankles are swelling.  I told her I would contact Dr. Gerrit Friends for advice and call her back.

## 2012-03-31 ENCOUNTER — Encounter (INDEPENDENT_AMBULATORY_CARE_PROVIDER_SITE_OTHER): Payer: 59 | Admitting: Surgery

## 2012-04-05 ENCOUNTER — Encounter (INDEPENDENT_AMBULATORY_CARE_PROVIDER_SITE_OTHER): Payer: Self-pay | Admitting: Surgery

## 2012-04-05 ENCOUNTER — Ambulatory Visit (INDEPENDENT_AMBULATORY_CARE_PROVIDER_SITE_OTHER): Payer: 59 | Admitting: Surgery

## 2012-04-05 VITALS — BP 126/74 | HR 74 | Temp 99.6°F | Resp 16 | Ht 63.0 in | Wt 251.2 lb

## 2012-04-05 DIAGNOSIS — T07XXXA Unspecified multiple injuries, initial encounter: Secondary | ICD-10-CM

## 2012-04-05 DIAGNOSIS — T148XXD Other injury of unspecified body region, subsequent encounter: Secondary | ICD-10-CM

## 2012-04-05 NOTE — Patient Instructions (Signed)
  COCOA BUTTER & VITAMIN E CREAM  ([employer] or other brand)  Apply cocoa butter/vitamin E cream to your incision 2 - 3 times daily.  Massage cream into incision for one minute with each application.  Use sunscreen (50 SPF or higher) for first 6 months after surgery if area is exposed to sun.  You may substitute Mederma or other scar reducing creams as desired.   

## 2012-04-05 NOTE — Progress Notes (Signed)
General Surgery [facility]  Visit Diagnoses: 1. Wound healing, delayed     HISTORY: The patient returns for wound check. The incision has now been closed for several weeks. She's had no further drainage. She denies fevers or chills. She is working full time.  EXAM: Surgical incision is completely epithelialized. There is some induration along the central portion of the wound. There is no erythema. With Valsalva there is no sign of recurrent hernia.  IMPRESSION: Status post umbilical hernia repair with mesh, delayed wound healing by secondary intention  PLAN: Patient is released to full activity without restriction. She will apply topical creams to her incision. She will contact our office if there are any further problems.  Velora Heckler, MD, FACS General & Endocrine Surgery [facility]

## 2012-04-12 ENCOUNTER — Telehealth (INDEPENDENT_AMBULATORY_CARE_PROVIDER_SITE_OTHER): Payer: Self-pay

## 2012-04-12 NOTE — Telephone Encounter (Signed)
Pt called stating until today her abd wd had been sealed and dry. She states that a couple of hours ago she noticed clear watery drainage coming from her umbilicus. No redness,swelling or foul odor. Pt states it was about a teaspoon amt. Pt to clean area and dry with a qtip. Pt to cover area with dry dsg and if drainage continues she will call to have one of our MDs ck area since her wound was below the umbilical area. Pt states she understands.

## 2012-04-13 ENCOUNTER — Encounter (INDEPENDENT_AMBULATORY_CARE_PROVIDER_SITE_OTHER): Payer: 59 | Admitting: Surgery

## 2012-04-13 ENCOUNTER — Ambulatory Visit (INDEPENDENT_AMBULATORY_CARE_PROVIDER_SITE_OTHER): Payer: 59 | Admitting: Surgery

## 2012-04-13 ENCOUNTER — Encounter (INDEPENDENT_AMBULATORY_CARE_PROVIDER_SITE_OTHER): Payer: Self-pay | Admitting: Surgery

## 2012-04-13 VITALS — BP 156/86 | HR 108 | Temp 99.8°F | Resp 20 | Ht 63.0 in | Wt 252.0 lb

## 2012-04-13 DIAGNOSIS — IMO0002 Reserved for concepts with insufficient information to code with codable children: Secondary | ICD-10-CM

## 2012-04-13 NOTE — Progress Notes (Signed)
Urgent Office This is a patient of Dr. Ardine Eng who has had a long course with drainage from an umbilical hernia repair.  He saw her last week and the wound was healed. However over the weekend she began having some yellowish brownish drainage coming from her umbilicus itself. She comes urgent office today. There is no surrounding erythema. She is afebrile. She does have some tenderness.  Filed Vitals:   04/13/12 1605  BP: 156/86  Pulse: 108  Temp: 99.8 F (37.7 C)  Resp: 20    Her incision is well-healed with no erythema. There is some thin drainage coming from the depths of her umbilicus. I tried to use cotton swabs to expose the base of her umbilicus. I could not see an opening but there is clearly some fluid coming from the base of her umbilicus. I instruct her to clean this daily with hydrogen peroxide and then to talk a week of dry gauze deep down into her umbilicus. We will have her see Dr. Gerrit Friends at his next available appointment. I do not see an indication for antibiotics at this time.  Wilmon Arms. Corliss Skains, MD, [facility]  04/13/2012 5:05 PM

## 2012-04-27 ENCOUNTER — Ambulatory Visit (INDEPENDENT_AMBULATORY_CARE_PROVIDER_SITE_OTHER): Payer: 59 | Admitting: Surgery

## 2012-04-27 ENCOUNTER — Encounter (INDEPENDENT_AMBULATORY_CARE_PROVIDER_SITE_OTHER): Payer: Self-pay | Admitting: Surgery

## 2012-04-27 VITALS — BP 158/80 | HR 100 | Temp 97.3°F | Resp 16 | Ht 63.0 in | Wt 251.0 lb

## 2012-04-27 DIAGNOSIS — T07XXXA Unspecified multiple injuries, initial encounter: Secondary | ICD-10-CM

## 2012-04-27 DIAGNOSIS — T148XXD Other injury of unspecified body region, subsequent encounter: Secondary | ICD-10-CM

## 2012-04-27 NOTE — Patient Instructions (Signed)
Continue dressing changes as instructed.  tmg

## 2012-04-27 NOTE — Progress Notes (Signed)
General Surgery [facility]  Visit Diagnoses: 1. Wound healing, delayed     HISTORY: Patient was seen by my partner 2 weeks ago for drainage from within the umbilicus. The surgical wound remains well-healed. There was no sign of infection. She has been cleansing the umbilicus with hydrogen peroxide and dressing it with a dry gauze packing.  EXAM: Surgical wound below the umbilicus is well-healed and completely epithelialized. Within the umbilicus the skin appears healthy and viable. There is no granulation tissue. There is no ulceration. There is a small amount of yellowish drainage on the dry gauze dressing. Valsalva there is no sign of recurrent hernia. There is no significant tenderness.  IMPRESSION: Chronic nonhealing wound following umbilical hernia repair with mesh  PLAN: Patient will continue cleansing the umbilicus with hydrogen peroxide and packing with a dry gauze dressing. Antibiotics are not indicated at this time. She will return for followup in 3 weeks.  Velora Heckler, MD, FACS General & Endocrine Surgery [facility]

## 2012-04-28 ENCOUNTER — Observation Stay (HOSPITAL_COMMUNITY)
Admission: EM | Admit: 2012-04-28 | Discharge: 2012-04-28 | Disposition: A | Discharge status: Home / Self Care | Payer: No Typology Code available for payment source | Attending: Emergency Medicine | Admitting: Emergency Medicine

## 2012-04-28 ENCOUNTER — Observation Stay (HOSPITAL_COMMUNITY): Payer: No Typology Code available for payment source

## 2012-04-28 ENCOUNTER — Encounter (HOSPITAL_COMMUNITY): Payer: Self-pay | Admitting: Emergency Medicine

## 2012-04-28 DIAGNOSIS — Z8639 Personal history of other endocrine, nutritional and metabolic disease: Secondary | ICD-10-CM | POA: Insufficient documentation

## 2012-04-28 DIAGNOSIS — M542 Cervicalgia: Secondary | ICD-10-CM | POA: Insufficient documentation

## 2012-04-28 DIAGNOSIS — S301XXA Contusion of abdominal wall, initial encounter: Principal | ICD-10-CM | POA: Insufficient documentation

## 2012-04-28 DIAGNOSIS — Z87448 Personal history of other diseases of urinary system: Secondary | ICD-10-CM | POA: Insufficient documentation

## 2012-04-28 DIAGNOSIS — E7800 Pure hypercholesterolemia, unspecified: Secondary | ICD-10-CM | POA: Insufficient documentation

## 2012-04-28 DIAGNOSIS — I10 Essential (primary) hypertension: Secondary | ICD-10-CM | POA: Insufficient documentation

## 2012-04-28 DIAGNOSIS — Z862 Personal history of diseases of the blood and blood-forming organs and certain disorders involving the immune mechanism: Secondary | ICD-10-CM | POA: Insufficient documentation

## 2012-04-28 DIAGNOSIS — N390 Urinary tract infection, site not specified: Secondary | ICD-10-CM | POA: Insufficient documentation

## 2012-04-28 DIAGNOSIS — F3289 Other specified depressive episodes: Secondary | ICD-10-CM | POA: Insufficient documentation

## 2012-04-28 DIAGNOSIS — F329 Major depressive disorder, single episode, unspecified: Secondary | ICD-10-CM | POA: Insufficient documentation

## 2012-04-28 DIAGNOSIS — F411 Generalized anxiety disorder: Secondary | ICD-10-CM | POA: Insufficient documentation

## 2012-04-28 DIAGNOSIS — Y92410 Unspecified street and highway as the place of occurrence of the external cause: Secondary | ICD-10-CM | POA: Insufficient documentation

## 2012-04-28 DIAGNOSIS — R002 Palpitations: Secondary | ICD-10-CM | POA: Insufficient documentation

## 2012-04-28 DIAGNOSIS — M549 Dorsalgia, unspecified: Secondary | ICD-10-CM | POA: Insufficient documentation

## 2012-04-28 DIAGNOSIS — Y9389 Activity, other specified: Secondary | ICD-10-CM | POA: Insufficient documentation

## 2012-04-28 DIAGNOSIS — Z8669 Personal history of other diseases of the nervous system and sense organs: Secondary | ICD-10-CM | POA: Insufficient documentation

## 2012-04-28 DIAGNOSIS — Z79899 Other long term (current) drug therapy: Secondary | ICD-10-CM | POA: Insufficient documentation

## 2012-04-28 LAB — CBC WITH DIFFERENTIAL/PLATELET
Basophils Absolute: 0 10*3/uL (ref 0.0–0.1)
Eosinophils Relative: 2 % (ref 0–5)
HCT: 43.6 % (ref 36.0–46.0)
Hemoglobin: 14.7 g/dL (ref 12.0–15.0)
Lymphocytes Relative: 34 % (ref 12–46)
MCHC: 33.7 g/dL (ref 30.0–36.0)
MCV: 88.1 fL (ref 78.0–100.0)
Monocytes Absolute: 0.4 10*3/uL (ref 0.1–1.0)
Monocytes Relative: 5 % (ref 3–12)
RDW: 14 % (ref 11.5–15.5)
WBC: 8.1 10*3/uL (ref 4.0–10.5)

## 2012-04-28 LAB — URINALYSIS, ROUTINE W REFLEX MICROSCOPIC
Bilirubin Urine: NEGATIVE
Glucose, UA: NEGATIVE mg/dL
Specific Gravity, Urine: 1.008 (ref 1.005–1.030)
Urobilinogen, UA: 0.2 mg/dL (ref 0.0–1.0)

## 2012-04-28 LAB — POCT I-STAT, CHEM 8
BUN: 7 mg/dL (ref 6–23)
Chloride: 105 mEq/L (ref 96–112)
Creatinine, Ser: 0.8 mg/dL (ref 0.50–1.10)
Glucose, Bld: 120 mg/dL — ABNORMAL HIGH (ref 70–99)
Hemoglobin: 15 g/dL (ref 12.0–15.0)
Potassium: 3.6 mEq/L (ref 3.5–5.1)
Sodium: 140 mEq/L (ref 135–145)

## 2012-04-28 LAB — URINE MICROSCOPIC-ADD ON

## 2012-04-28 MED ORDER — IOHEXOL 300 MG/ML  SOLN
80.0000 mL | Freq: Once | INTRAMUSCULAR | Status: AC | PRN
  Administered 2012-04-28: 80 mL via INTRAVENOUS

## 2012-04-28 MED ORDER — CEPHALEXIN 500 MG PO CAPS: 500.0000 mg | ORAL_CAPSULE | Freq: Four times a day (QID) | ORAL | Status: DC

## 2012-04-28 MED ORDER — MORPHINE SULFATE 4 MG/ML IJ SOLN
4.0000 mg | Freq: Once | INTRAMUSCULAR | Status: AC
  Administered 2012-04-28: 4 mg via INTRAVENOUS
  Filled 2012-04-28: qty 1

## 2012-04-28 MED ORDER — SODIUM CHLORIDE 0.9 % IV SOLN
INTRAVENOUS | Status: DC
  Administered 2012-04-28: 14:00:00 via INTRAVENOUS

## 2012-04-28 NOTE — ED Notes (Signed)
Pt given 2x2 gauze to put in her unbilicus and a 2x2 to cover her umbilicus. No visible open wound
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
## 2012-04-28 NOTE — ED Notes (Signed)
Pt has arrived in cdu from ct
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
## 2012-04-28 NOTE — ED Provider Notes (Signed)
Medical screening examination/treatment/procedure(s) were conducted as a shared visit with non-physician practitioner(s) and myself.  I personally evaluated the patient during the encounter  Doug Sou, MD 04/28/12 [phone]
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
## 2012-04-29 ENCOUNTER — Telehealth (INDEPENDENT_AMBULATORY_CARE_PROVIDER_SITE_OTHER): Payer: Self-pay | Admitting: General Surgery

## 2012-04-29 LAB — URINE CULTURE
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
## 2012-04-30 ENCOUNTER — Telehealth (INDEPENDENT_AMBULATORY_CARE_PROVIDER_SITE_OTHER): Payer: Self-pay | Admitting: General Surgery
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
## 2012-05-17 ENCOUNTER — Ambulatory Visit (INDEPENDENT_AMBULATORY_CARE_PROVIDER_SITE_OTHER): Payer: 59 | Admitting: Surgery

## 2012-05-17 ENCOUNTER — Encounter (INDEPENDENT_AMBULATORY_CARE_PROVIDER_SITE_OTHER): Payer: Self-pay | Admitting: Surgery

## 2012-05-17 VITALS — BP 164/90 | HR 100 | Temp 97.6°F | Resp 18 | Wt 253.0 lb

## 2012-05-17 DIAGNOSIS — T148XXD Other injury of unspecified body region, subsequent encounter: Secondary | ICD-10-CM

## 2012-05-17 DIAGNOSIS — T07XXXA Unspecified multiple injuries, initial encounter: Secondary | ICD-10-CM
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
## 2012-06-16 HISTORY — PX: TYMPANOSTOMY TUBE PLACEMENT: SHX32

## 2012-09-14 ENCOUNTER — Encounter (HOSPITAL_COMMUNITY): Payer: Self-pay | Admitting: Radiology

## 2012-09-14 ENCOUNTER — Emergency Department (HOSPITAL_COMMUNITY): Payer: 59

## 2012-09-14 ENCOUNTER — Emergency Department (HOSPITAL_COMMUNITY)
Admission: EM | Admit: 2012-09-14 | Discharge: 2012-09-14 | Disposition: A | Discharge status: Home / Self Care | Payer: 59 | Attending: Emergency Medicine | Admitting: Emergency Medicine

## 2012-09-14 DIAGNOSIS — Z8669 Personal history of other diseases of the nervous system and sense organs: Secondary | ICD-10-CM | POA: Insufficient documentation

## 2012-09-14 DIAGNOSIS — Z8679 Personal history of other diseases of the circulatory system: Secondary | ICD-10-CM | POA: Insufficient documentation

## 2012-09-14 DIAGNOSIS — Z3202 Encounter for pregnancy test, result negative: Secondary | ICD-10-CM | POA: Insufficient documentation

## 2012-09-14 DIAGNOSIS — Z9889 Other specified postprocedural states: Secondary | ICD-10-CM | POA: Insufficient documentation

## 2012-09-14 DIAGNOSIS — R110 Nausea: Secondary | ICD-10-CM | POA: Insufficient documentation

## 2012-09-14 DIAGNOSIS — F3289 Other specified depressive episodes: Secondary | ICD-10-CM | POA: Insufficient documentation

## 2012-09-14 DIAGNOSIS — Z859 Personal history of malignant neoplasm, unspecified: Secondary | ICD-10-CM | POA: Insufficient documentation

## 2012-09-14 DIAGNOSIS — Z862 Personal history of diseases of the blood and blood-forming organs and certain disorders involving the immune mechanism: Secondary | ICD-10-CM | POA: Insufficient documentation

## 2012-09-14 DIAGNOSIS — Z79899 Other long term (current) drug therapy: Secondary | ICD-10-CM | POA: Insufficient documentation

## 2012-09-14 DIAGNOSIS — R109 Unspecified abdominal pain: Secondary | ICD-10-CM

## 2012-09-14 DIAGNOSIS — I10 Essential (primary) hypertension: Secondary | ICD-10-CM | POA: Insufficient documentation

## 2012-09-14 DIAGNOSIS — F411 Generalized anxiety disorder: Secondary | ICD-10-CM | POA: Insufficient documentation

## 2012-09-14 DIAGNOSIS — Z8639 Personal history of other endocrine, nutritional and metabolic disease: Secondary | ICD-10-CM | POA: Insufficient documentation

## 2012-09-14 DIAGNOSIS — Z8739 Personal history of other diseases of the musculoskeletal system and connective tissue: Secondary | ICD-10-CM | POA: Insufficient documentation

## 2012-09-14 DIAGNOSIS — Z8719 Personal history of other diseases of the digestive system: Secondary | ICD-10-CM | POA: Insufficient documentation

## 2012-09-14 DIAGNOSIS — F329 Major depressive disorder, single episode, unspecified: Secondary | ICD-10-CM | POA: Insufficient documentation

## 2012-09-14 DIAGNOSIS — Z87448 Personal history of other diseases of urinary system: Secondary | ICD-10-CM | POA: Insufficient documentation

## 2012-09-14 LAB — BASIC METABOLIC PANEL
Chloride: 104 mEq/L (ref 96–112)
Creatinine, Ser: 0.95 mg/dL (ref 0.50–1.10)
GFR calc Af Amer: 84 mL/min — ABNORMAL LOW (ref >90)

## 2012-09-14 LAB — CBC WITH DIFFERENTIAL/PLATELET
Basophils Relative: 0 % (ref 0–1)
Eosinophils Absolute: 0.1 10*3/uL (ref 0.0–0.7)
HCT: 42.4 % (ref 36.0–46.0)
Hemoglobin: 14.6 g/dL (ref 12.0–15.0)
Lymphs Abs: 2.4 10*3/uL (ref 0.7–4.0)
MCH: 30 pg (ref 26.0–34.0)
MCHC: 34.4 g/dL (ref 30.0–36.0)
MCV: 87.1 fL (ref 78.0–100.0)
Monocytes Absolute: 0.7 10*3/uL (ref 0.1–1.0)
Monocytes Relative: 6 % (ref 3–12)
Neutrophils Relative %: 74 % (ref 43–77)
RBC: 4.87 MIL/uL (ref 3.87–5.11)

## 2012-09-14 LAB — URINALYSIS, ROUTINE W REFLEX MICROSCOPIC
Ketones, ur: NEGATIVE mg/dL
Nitrite: NEGATIVE
Protein, ur: NEGATIVE mg/dL
pH: 7.5 (ref 5.0–8.0)

## 2012-09-14 LAB — URINE MICROSCOPIC-ADD ON

## 2012-09-14 MED ORDER — NITROFURANTOIN MONOHYD MACRO 100 MG PO CAPS: 100.0000 mg | ORAL_CAPSULE | Freq: Two times a day (BID) | ORAL | Status: DC

## 2012-09-14 MED ORDER — IOHEXOL 300 MG/ML  SOLN
50.0000 mL | Freq: Once | INTRAMUSCULAR | Status: AC | PRN
  Administered 2012-09-14: 50 mL via ORAL

## 2012-09-14 MED ORDER — SODIUM CHLORIDE 0.9 % IV BOLUS (SEPSIS)
1000.0000 mL | Freq: Once | INTRAVENOUS | Status: AC
  Administered 2012-09-14: 1000 mL via INTRAVENOUS

## 2012-09-14 MED ORDER — IOHEXOL 300 MG/ML  SOLN
100.0000 mL | Freq: Once | INTRAMUSCULAR | Status: AC | PRN
  Administered 2012-09-14: 100 mL via INTRAVENOUS

## 2012-09-14 NOTE — ED Provider Notes (Signed)
History     CSN: [id number]  Arrival date & time 09/14/12  1649   First MD Initiated Contact with Patient 09/14/12 1702      Chief Complaint  Patient presents with  . Abdominal Pain    (Consider location/radiation/quality/duration/timing/severity/associated sxs/prior treatment) HPI Comments: This is a 42 year old female, past medical history remarkable for hypertension,and hernia repair, who presents emergency department with chief complaint of abdominal pain. Patient states that she is taking these the restroom this morning, when she noticed pain in her right lower abdomen. She states that the pain was severe, and persisted after having the bowel movement. She denies seeing any bloody or bilious stool. She has not tried taking anything to alleviate her symptoms. She rates her pain a 3/10.  She denies fever, chills and vomiting, but does endorse some nausea.  The history is provided by the patient. No language interpreter was used.    Past Medical History  Diagnosis Date  . Hypertension   . Depression   . Bladder disorder     INCONTINENCE AND FREQUENCY  . Pain in joint, site unspecified   . Acute thyroiditis 2004    PROBLEM WITH THYROIDITIS RESOLVED  . Other and unspecified manifestations of thiamine deficiency   . Anxiety state, unspecified   . Insomnia, unspecified   . Pure hypercholesterolemia   . Cancer     SKIN CANCERS  . Heart palpitations     PT ON METOPROLOL  . PONV (postoperative nausea and vomiting)     SEVERE!!   NO N&V AFTER UMBILICAL HERNIA REPAIR AT [facility]    Past Surgical History  Procedure Laterality Date  . Gallbladder surgery    . Cholecystectomy  1995  . Hernia repair  09/05/11    UMBILICAL  . Eye surgery  1979    STRABISMUS REPAIR  . Tonsillectomy  1989  . Wisdom teeth extract 1996    . Vaginal wall nodules surgically removed 1998    . Wound debridement  12/11/2011    Procedure: DEBRIDEMENT ABDOMINAL WOUND;  Surgeon: Velora Heckler, MD;  Location: [facility];  Service: General;  Laterality: N/A;  Explore and debride abdominal wound    Family History  Problem Relation Age of Onset  . Heart failure Mother   . Heart disease Mother     History  Substance Use Topics  . Smoking status: Never Smoker   . Smokeless tobacco: Never Used  . Alcohol Use: No    OB History   Grav Para Term Preterm Abortions TAB SAB Ect Mult Living                  Review of Systems  All other systems reviewed and are negative.    Allergies  Erythromycin; Codeine; Lisinopril; and Penicillins  Home Medications   Current Outpatient Rx  Name  Route  Sig  Dispense  Refill  . buPROPion (WELLBUTRIN XL) 300 MG 24 hr tablet   Oral   Take 300 mg by mouth daily with breakfast.          . FLUoxetine (PROZAC) 20 MG capsule   Oral   Take 20 mg by mouth daily.         Marland Kitchen ibuprofen (ADVIL,MOTRIN) 200 MG tablet   Oral   Take 400-600 mg by mouth at bedtime as needed for pain.         . Norgestrel-Ethinyl Estradiol (CRYSELLE-28 PO)   Oral   Take 1 tablet by mouth daily.  There were no vitals taken for this visit.  Physical Exam  Nursing note and vitals reviewed. Constitutional: She is oriented to person, place, and time. She appears well-developed and well-nourished.  HENT:  Head: Normocephalic and atraumatic.  Eyes: Conjunctivae and EOM are normal. Pupils are equal, round, and reactive to light.  Neck: Normal range of motion. Neck supple.  Cardiovascular: Normal rate and regular rhythm.  Exam reveals no gallop and no friction rub.   No murmur heard. Pulmonary/Chest: Effort normal and breath sounds normal. No respiratory distress. She has no wheezes. She has no rales. She exhibits no tenderness.  Abdominal: Soft. Bowel sounds are normal. She exhibits no distension and no mass. There is tenderness. There is no rebound and no guarding.  RLQ tender to palpation, no obvious masses or herniations, prior umbilical  hernia repair  Musculoskeletal: Normal range of motion. She exhibits no edema and no tenderness.  Neurological: She is alert and oriented to person, place, and time.  Skin: Skin is warm and dry.  Psychiatric: She has a normal mood and affect. Her behavior is normal. Judgment and thought content normal.    ED Course  Procedures (including critical care time)  Labs Reviewed  CBC WITH DIFFERENTIAL  BASIC METABOLIC PANEL  URINALYSIS, ROUTINE W REFLEX MICROSCOPIC   Results for orders placed during the hospital encounter of 09/14/12  CBC WITH DIFFERENTIAL      Result Value Range   WBC 12.0 (*) 4.0 - 10.5 K/uL   RBC 4.87  3.87 - 5.11 MIL/uL   Hemoglobin 14.6  12.0 - 15.0 g/dL   HCT 16.1  09.6 - 04.5 %   MCV 87.1  78.0 - 100.0 fL   MCH 30.0  26.0 - 34.0 pg   MCHC 34.4  30.0 - 36.0 g/dL   RDW 40.9  81.1 - 91.4 %   Platelets 406 (*) 150 - 400 K/uL   Neutrophils Relative 74  43 - 77 %   Neutro Abs 8.8 (*) 1.7 - 7.7 K/uL   Lymphocytes Relative 20  12 - 46 %   Lymphs Abs 2.4  0.7 - 4.0 K/uL   Monocytes Relative 6  3 - 12 %   Monocytes Absolute 0.7  0.1 - 1.0 K/uL   Eosinophils Relative 1  0 - 5 %   Eosinophils Absolute 0.1  0.0 - 0.7 K/uL   Basophils Relative 0  0 - 1 %   Basophils Absolute 0.0  0.0 - 0.1 K/uL  BASIC METABOLIC PANEL      Result Value Range   Sodium 139  135 - 145 mEq/L   Potassium 3.9  3.5 - 5.1 mEq/L   Chloride 104  96 - 112 mEq/L   CO2 25  19 - 32 mEq/L   Glucose, Bld 104 (*) 70 - 99 mg/dL   BUN 13  6 - 23 mg/dL   Creatinine, Ser 7.82  0.50 - 1.10 mg/dL   Calcium 95.6  8.4 - 21.3 mg/dL   GFR calc non Af Amer 73 (*) >90 mL/min   GFR calc Af Amer 84 (*) >90 mL/min  URINALYSIS, ROUTINE W REFLEX MICROSCOPIC      Result Value Range   Color, Urine YELLOW  YELLOW   APPearance CLEAR  CLEAR   Specific Gravity, Urine 1.006  1.005 - 1.030   pH 7.5  5.0 - 8.0   Glucose, UA NEGATIVE  NEGATIVE mg/dL   Hgb urine dipstick SMALL (*) NEGATIVE   Bilirubin Urine NEGATIVE  NEGATIVE   Ketones, ur NEGATIVE  NEGATIVE mg/dL   Protein, ur NEGATIVE  NEGATIVE mg/dL   Urobilinogen, UA 0.2  0.0 - 1.0 mg/dL   Nitrite NEGATIVE  NEGATIVE   Leukocytes, UA MODERATE (*) NEGATIVE  URINE MICROSCOPIC-ADD ON      Result Value Range   Squamous Epithelial / LPF FEW (*) RARE   WBC, UA 7-10  <3 WBC/hpf   RBC / HPF 0-2  <3 RBC/hpf   Bacteria, UA FEW (*) RARE  POCT PREGNANCY, URINE      Result Value Range   Preg Test, Ur NEGATIVE  NEGATIVE   Ct Abdomen Pelvis W Contrast  09/14/2012  *RADIOLOGY REPORT*  Clinical Data: Right lower quadrant abdominal pain  CT ABDOMEN AND PELVIS WITH CONTRAST  Technique:  Multidetector CT imaging of the abdomen and pelvis was performed following the standard protocol during bolus administration of intravenous contrast.  Contrast: OMNIPAQUE IOHEXOL 300 MG/ML  SOLN  Comparison: 04/28/2012  Findings: Lung bases are clear.  Liver, spleen, pancreas, and adrenal glands within normal limits.  Status post cholecystectomy.  No intrahepatic or extrahepatic ductal dilatation.  11 mm interpolar right renal cyst (series 5/image 13).  11 mm interpolar left renal cyst (series 5/image 10).  No hydronephrosis.  No evidence of bowel obstruction.  Normal appendix.  Left colon is decompressed.  No evidence of abdominal aortic aneurysm.  No abdominopelvic ascites.  No suspicious abdominopelvic lymphadenopathy.  Uterus and bilateral ovaries are unremarkable.  Bladder is within normal limits.  Mild residual soft tissue / stranding in the periumbilical region (series 2/image 68), improved.  Visualized osseous structures are within normal limits.  IMPRESSION: Normal appendix.  No evidence of bowel obstruction.  Mild residual soft tissue / stranding in the periumbilical region, improved.   Original Report Authenticated By: Charline Bills, M.D.       1. Abdominal  pain, other specified site       MDM  42 year old female with right lower quadrant pain. Pain was sudden in  onset. Associated with a large bowel movement, but lingered after having a bowel movement. She still had right lower quadrant pain in the emergency department, which was reproducible on my exam. Will order CT abdomen, to rule out appendectomy.  CT is negative. Patient still having mild amount of pain, but is feeling better overall. Patient has a UTI, which I will treat with Macrobid. Will discharge with primary care followup. Strict return precautions have been given. Patient is stable and ready for discharge.        Roxy Horseman, PA-C 09/15/12 0101

## 2012-09-14 NOTE — ED Notes (Signed)
Pt dc to home.  Pt states understanding to  dc instructions.  Pt ambulatory to dc

## 2012-09-14 NOTE — ED Notes (Signed)
Pt states she is done with contrast.

## 2012-09-14 NOTE — ED Notes (Signed)
Pt from work she she had a sudden onset of abdominal pain.  Pt localizes pain to right lower quadrant.  Attempted to go to restroom which increased pain.  Pt able to pass bm but stated that pain continued to get worse afterwards.  Pt flushed and hypertensive when EMS arrived.  BP 218/120 when EMS arrived.  Pt also ST.  Pt reports being diaphoretic prior to EMS arrival. IV 20 LAC.  Pt denies nausea or vomiting.  Pt has hx of umbilical hernia in March and June.  CBG 91.

## 2012-09-15 IMAGING — CT CT ABD-PELV W/O CM
3 of 4 series · 12 of 36 positions shown, 19 images · non-contrast
Comparison: 09/14/2011

CLINICAL DATA: Status post umbilical hernia repair.  Evaluate
seroma.  Post aspiration.

CT ABDOMEN AND PELVIS WITHOUT CONTRAST
TECHNIQUE: Multidetector CT imaging of the abdomen and pelvis was
performed following the standard protocol without intravenous
contrast.

[Series 3: abd/pelvis w/o · axial · non-contrast · 0.70mm/px · z∈[-410,-30]mm · 9 of 96 slices shown, 15 images]
[im 10/96  soft-tissue]
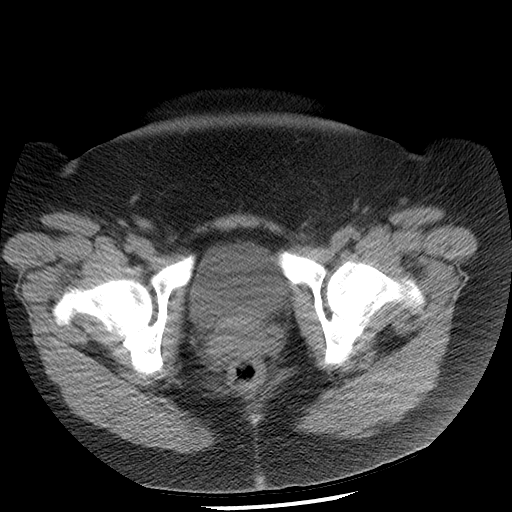
[im 10/96  bone]
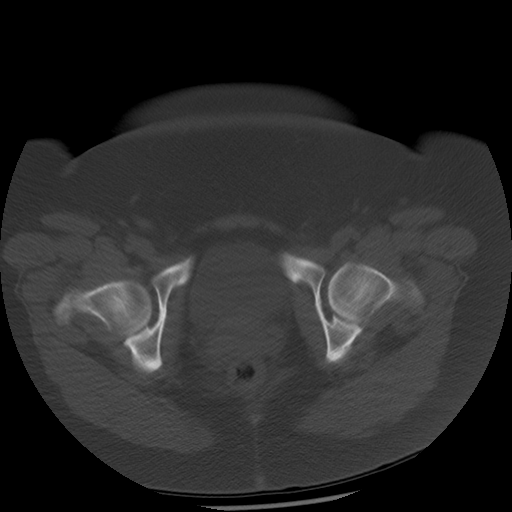
[im 20/96  soft-tissue]
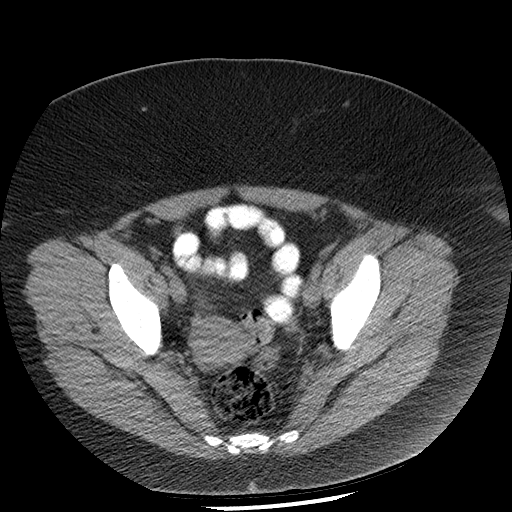
[im 29/96  soft-tissue]
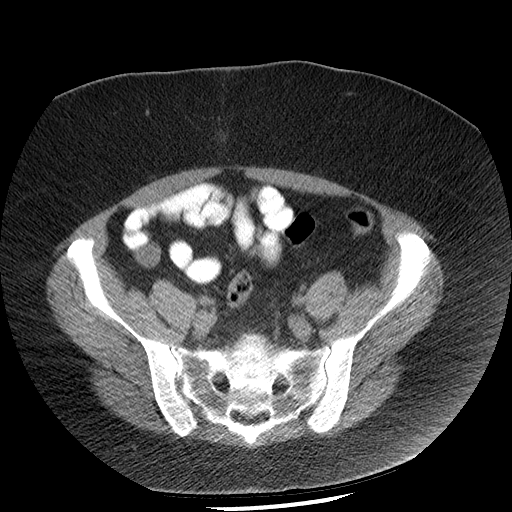
[im 39/96  soft-tissue]
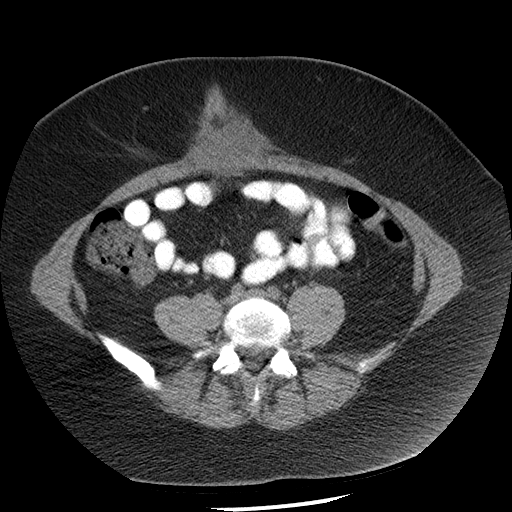
[im 48/96  soft-tissue]
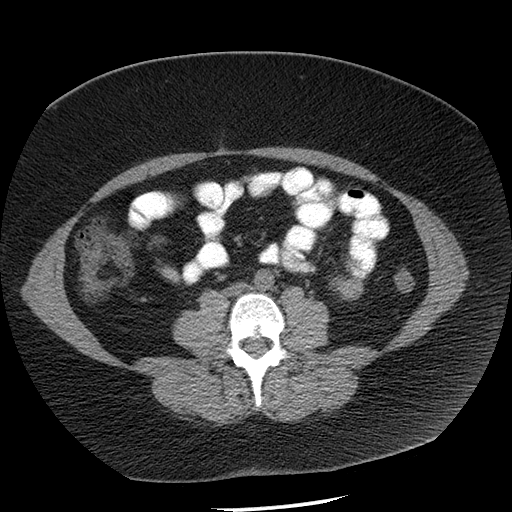
[im 58/96  soft-tissue]
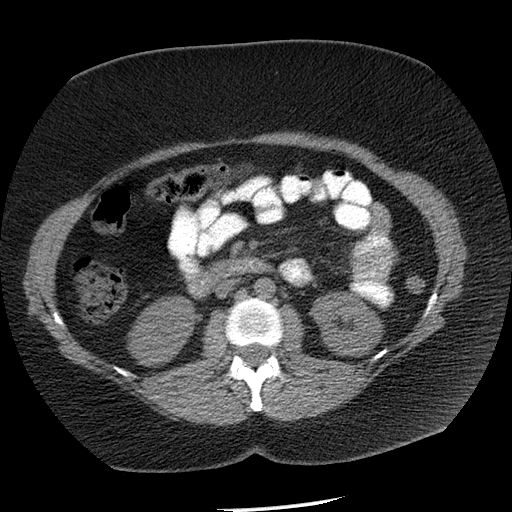
[im 58/96  lung]
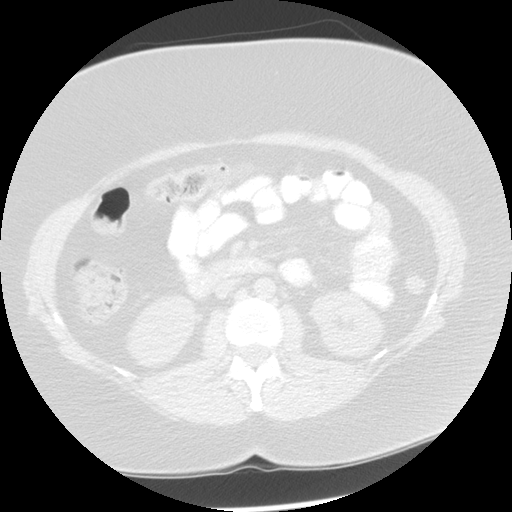
[im 67/96  soft-tissue]
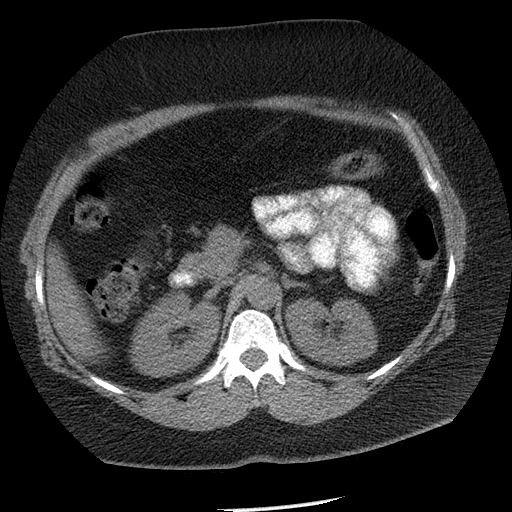
[im 67/96  lung]
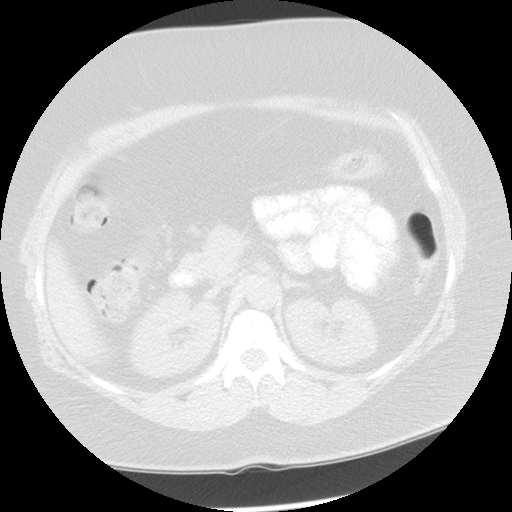
[im 77/96  soft-tissue]
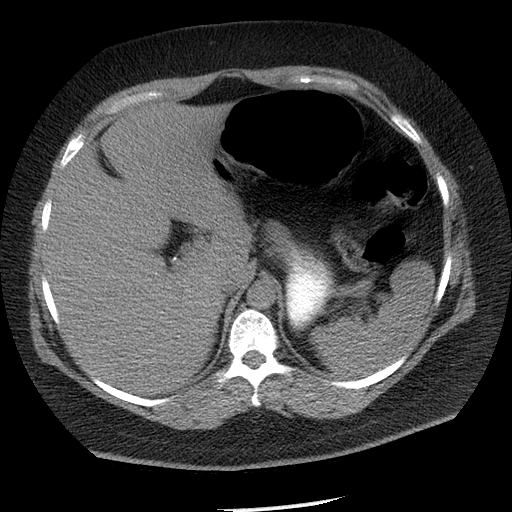
[im 77/96  lung]
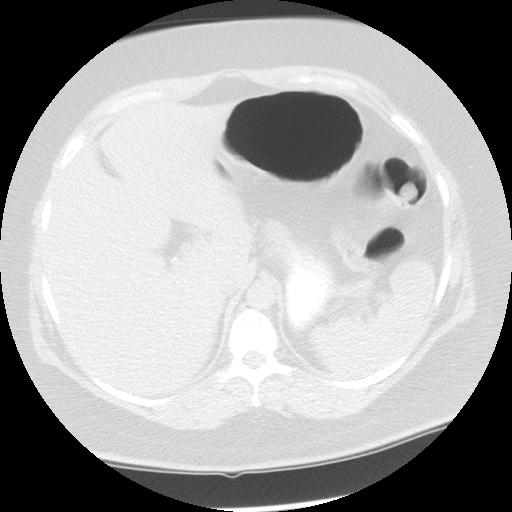
[im 86/96  soft-tissue]
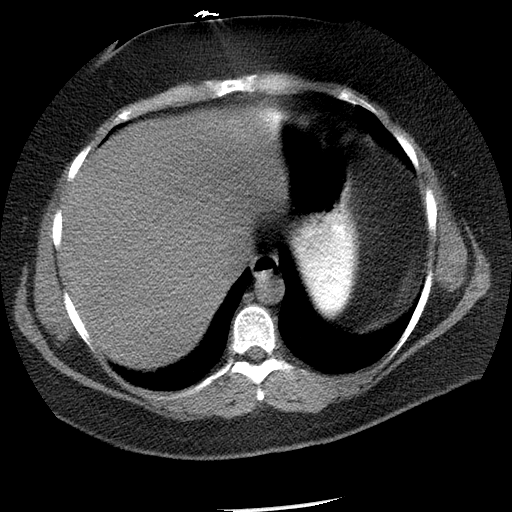
[im 86/96  lung]
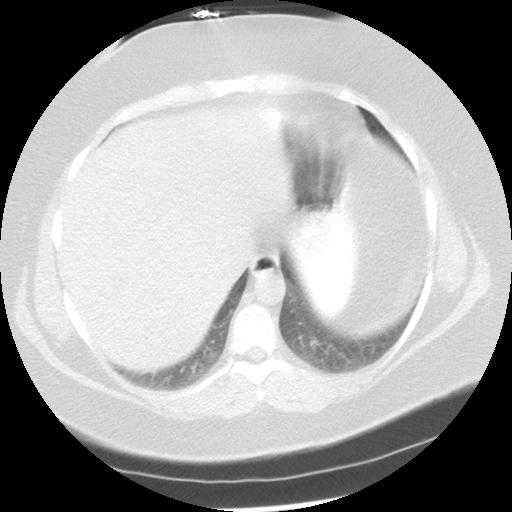
[im 86/96  bone]
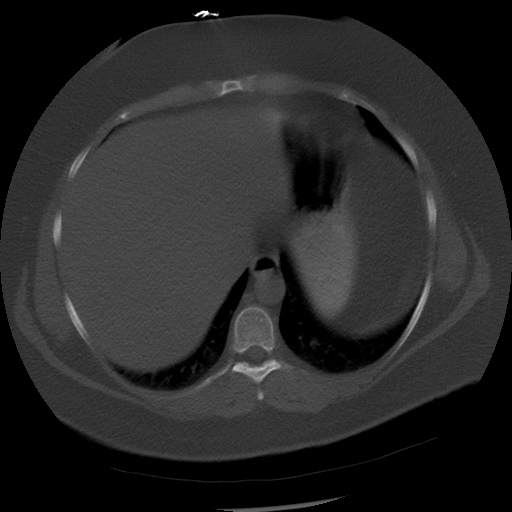

[Series 601: coronal body · coronal · 0.94mm/px · 1 of 140 slices shown, 2 images]
[im 47/140  soft-tissue]
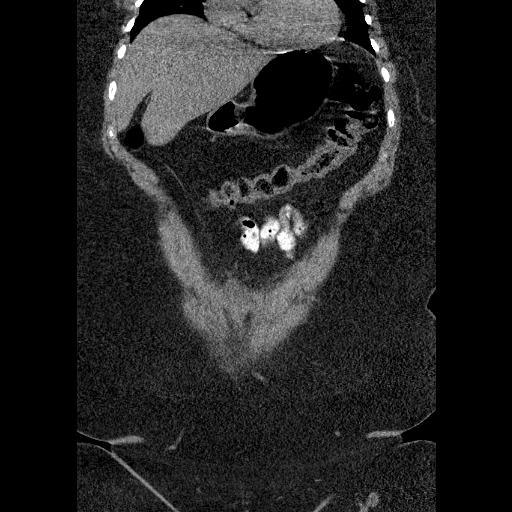
[im 47/140  bone]
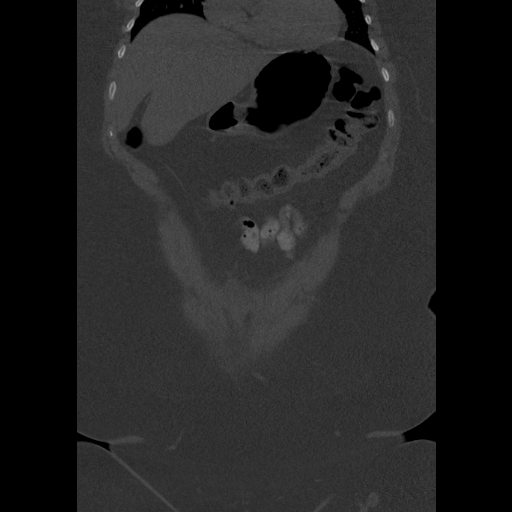

[Series 602: sagittal body · sagittal · 0.94mm/px · 2 of 145 slices shown]
[im 17/145  soft-tissue]
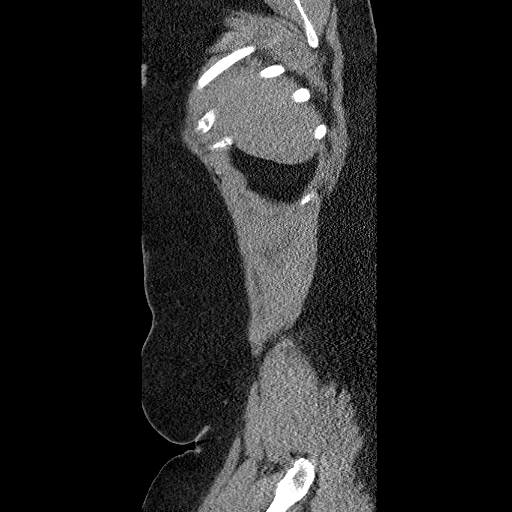
[im 34/145  soft-tissue]
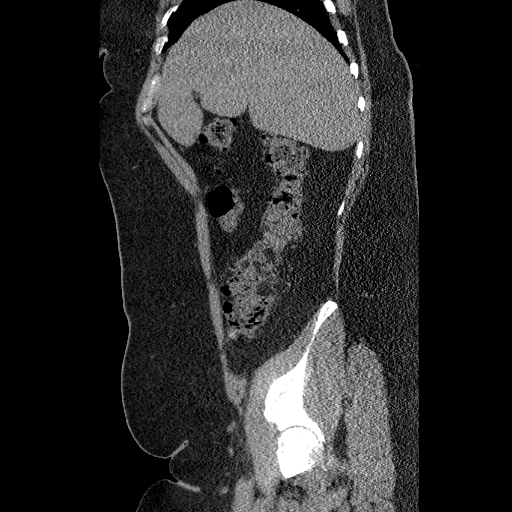

[12 of 36 positions shown; findings below may reference images not displayed]

FINDINGS: Complex fluid collection within the anterior subcutaneous
soft tissues at the level of prior hernia repair again noted,
smaller when compared to prior study.  This currently measures
x 2.9 cm on image 59 compared to 7.5 x 4.5 cm previously.  Small
locules of gas again noted within the fluid collection.

Lung bases are clear.  No effusions.  Heart is normal size.  Prior
cholecystectomy.  Liver, spleen, pancreas, adrenals, kidneys
unremarkable.

Bowel grossly unremarkable.  No free fluid, free air, or
adenopathy.  Uterus, adnexa, urinary bladder unremarkable.  Aorta
is normal caliber.  No bony abnormality.
IMPRESSION: Decreasing size of the fluid collection in the anterior
subcutaneous soft tissues at the site of prior hernia repair.

## 2012-09-15 NOTE — ED Provider Notes (Signed)
Medical screening examination/treatment/procedure(s) were performed by non-physician practitioner and as supervising physician I was immediately available for consultation/collaboration.  BP 118/74  Pulse 111  Temp(Src) 98.7 F (37.1 C)  Resp 16  SpO2 98%  LMP 08/16/2012   Glynn Octave, MD 09/15/12 [phone]

## 2012-09-16 LAB — URINE CULTURE: Colony Count: 50000

## 2015-07-19 ENCOUNTER — Telehealth: Payer: Self-pay | Admitting: *Deleted

## 2015-08-20 NOTE — Telephone Encounter (Signed)
Entered in error

## 2016-06-25 DIAGNOSIS — Z01419 Encounter for gynecological examination (general) (routine) without abnormal findings: Secondary | ICD-10-CM | POA: Diagnosis not present

## 2016-06-25 DIAGNOSIS — Z124 Encounter for screening for malignant neoplasm of cervix: Secondary | ICD-10-CM | POA: Diagnosis not present

## 2016-07-19 DIAGNOSIS — H04123 Dry eye syndrome of bilateral lacrimal glands: Secondary | ICD-10-CM | POA: Diagnosis not present

## 2016-07-19 DIAGNOSIS — H40033 Anatomical narrow angle, bilateral: Secondary | ICD-10-CM | POA: Diagnosis not present

## 2016-07-21 DIAGNOSIS — H578 Other specified disorders of eye and adnexa: Secondary | ICD-10-CM | POA: Diagnosis not present

## 2016-08-06 DIAGNOSIS — I10 Essential (primary) hypertension: Secondary | ICD-10-CM | POA: Diagnosis not present

## 2016-08-27 DIAGNOSIS — H04123 Dry eye syndrome of bilateral lacrimal glands: Secondary | ICD-10-CM | POA: Diagnosis not present

## 2017-07-03 DIAGNOSIS — Z1231 Encounter for screening mammogram for malignant neoplasm of breast: Secondary | ICD-10-CM | POA: Diagnosis not present

## 2017-10-21 DIAGNOSIS — M2550 Pain in unspecified joint: Secondary | ICD-10-CM | POA: Diagnosis not present

## 2017-10-21 DIAGNOSIS — M79669 Pain in unspecified lower leg: Secondary | ICD-10-CM | POA: Diagnosis not present

## 2018-01-09 DIAGNOSIS — H04123 Dry eye syndrome of bilateral lacrimal glands: Secondary | ICD-10-CM | POA: Diagnosis not present

## 2018-01-09 DIAGNOSIS — H40033 Anatomical narrow angle, bilateral: Secondary | ICD-10-CM | POA: Diagnosis not present

## 2018-04-20 DIAGNOSIS — L03211 Cellulitis of face: Secondary | ICD-10-CM | POA: Diagnosis not present

## 2018-05-19 DIAGNOSIS — X32XXXA Exposure to sunlight, initial encounter: Secondary | ICD-10-CM | POA: Diagnosis not present

## 2018-05-19 DIAGNOSIS — L570 Actinic keratosis: Secondary | ICD-10-CM | POA: Diagnosis not present

## 2018-05-19 DIAGNOSIS — D0339 Melanoma in situ of other parts of face: Secondary | ICD-10-CM | POA: Diagnosis not present

## 2018-06-21 DIAGNOSIS — C4339 Malignant melanoma of other parts of face: Secondary | ICD-10-CM | POA: Diagnosis not present

## 2018-06-22 DIAGNOSIS — C4339 Malignant melanoma of other parts of face: Secondary | ICD-10-CM | POA: Diagnosis not present

## 2018-06-22 DIAGNOSIS — R238 Other skin changes: Secondary | ICD-10-CM | POA: Diagnosis not present

## 2018-06-28 DIAGNOSIS — C4339 Malignant melanoma of other parts of face: Secondary | ICD-10-CM | POA: Diagnosis not present

## 2018-06-28 DIAGNOSIS — Z9889 Other specified postprocedural states: Secondary | ICD-10-CM | POA: Diagnosis not present

## 2018-06-28 DIAGNOSIS — Z483 Aftercare following surgery for neoplasm: Secondary | ICD-10-CM | POA: Diagnosis not present

## 2018-07-28 DIAGNOSIS — D485 Neoplasm of uncertain behavior of skin: Secondary | ICD-10-CM | POA: Diagnosis not present

## 2018-07-28 DIAGNOSIS — Z1283 Encounter for screening for malignant neoplasm of skin: Secondary | ICD-10-CM | POA: Diagnosis not present

## 2018-07-29 DIAGNOSIS — Z1231 Encounter for screening mammogram for malignant neoplasm of breast: Secondary | ICD-10-CM | POA: Diagnosis not present

## 2018-07-29 DIAGNOSIS — Z01419 Encounter for gynecological examination (general) (routine) without abnormal findings: Secondary | ICD-10-CM | POA: Diagnosis not present

## 2018-07-29 DIAGNOSIS — Z124 Encounter for screening for malignant neoplasm of cervix: Secondary | ICD-10-CM | POA: Diagnosis not present

## 2018-08-05 DIAGNOSIS — D485 Neoplasm of uncertain behavior of skin: Secondary | ICD-10-CM | POA: Diagnosis not present

## 2018-08-09 DIAGNOSIS — N951 Menopausal and female climacteric states: Secondary | ICD-10-CM | POA: Diagnosis not present

## 2018-08-30 DIAGNOSIS — Z85820 Personal history of malignant melanoma of skin: Secondary | ICD-10-CM | POA: Diagnosis not present

## 2018-10-27 DIAGNOSIS — D485 Neoplasm of uncertain behavior of skin: Secondary | ICD-10-CM | POA: Diagnosis not present

## 2018-10-27 DIAGNOSIS — D225 Melanocytic nevi of trunk: Secondary | ICD-10-CM | POA: Diagnosis not present

## 2018-10-28 DIAGNOSIS — M952 Other acquired deformity of head: Secondary | ICD-10-CM | POA: Diagnosis not present

## 2018-10-28 DIAGNOSIS — L905 Scar conditions and fibrosis of skin: Secondary | ICD-10-CM | POA: Diagnosis not present

## 2018-10-28 DIAGNOSIS — Z85820 Personal history of malignant melanoma of skin: Secondary | ICD-10-CM | POA: Diagnosis not present

## 2018-11-01 DIAGNOSIS — R42 Dizziness and giddiness: Secondary | ICD-10-CM | POA: Diagnosis not present

## 2021-01-29 ENCOUNTER — Other Ambulatory Visit: Payer: Self-pay | Admitting: Obstetrics and Gynecology

## 2021-01-29 DIAGNOSIS — Z1231 Encounter for screening mammogram for malignant neoplasm of breast: Secondary | ICD-10-CM

## 2021-02-07 ENCOUNTER — Other Ambulatory Visit: Payer: Self-pay

## 2021-02-07 ENCOUNTER — Ambulatory Visit
Admission: RE | Admit: 2021-02-07 | Discharge: 2021-02-07 | Disposition: A | Discharge status: Home / Self Care | Payer: 59 | Source: Ambulatory Visit | Attending: Obstetrics and Gynecology | Admitting: Obstetrics and Gynecology

## 2021-02-07 DIAGNOSIS — Z1231 Encounter for screening mammogram for malignant neoplasm of breast: Secondary | ICD-10-CM

## 2021-02-14 DIAGNOSIS — Z8616 Personal history of COVID-19: Secondary | ICD-10-CM

## 2021-02-14 HISTORY — DX: Personal history of COVID-19: Z86.16

## 2021-04-02 NOTE — Progress Notes (Signed)
[facility] New Patient Evaluation and Consultation  Referring Provider: Rowland Lathe, MD PCP: Harlan Stains, MD Date of Service: 04/03/2021  SUBJECTIVE Chief Complaint: New Patient (Initial Visit) Amanda Baker is a 50 y.o. female here for a consult on incontinence./)  History of Present Illness: Amanda Baker is a 50 y.o. White or Caucasian female seen in consultation at the request of Dr. Brien Mates for evaluation of incontinence.    Review of records from Dr Brien Mates significant for: Patient reports leakage with urgency and other times without.   Urinary Symptoms: Leaks urine with cough/ sneeze, laughing, exercise, lifting, going from sitting to standing, with a full bladder, with movement to the bathroom, with urgency, and without sensation Leaks numerous time(s) per day.  Pad use: 2 pads per day.   She is bothered by her UI symptoms.  Day time voids 7.  Nocturia: 2 times per night to void. Voiding dysfunction: she empties her bladder well.  does not use a catheter to empty bladder.  When urinating, she feels a weak stream and dribbling after finishing Drinks: cheerwine, tea per day, very little water  UTIs:  0  UTI's in the last year.   Denies history of kidney or bladder stones  Pelvic Organ Prolapse Symptoms:                  She Denies a feeling of a bulge the vaginal area.  Bowel Symptom: Bowel movements: 1 time(s) per day or every other Stool consistency: hard or soft  Straining: yes, sometimes  Splinting: no.  Incomplete evacuation: no.  She Denies accidental bowel leakage / fecal incontinence  Sexual Function Sexually active: yes.  Pain with sex: Yes, has discomfort due to prolapse, has discomfort due to dryness  Pelvic Pain Denies pelvic pain  Past Medical History:  Past Medical History:  Diagnosis Date   Acute thyroiditis 2004   PROBLEM WITH THYROIDITIS RESOLVED   Anxiety state, unspecified    Bladder disorder     INCONTINENCE AND FREQUENCY   Cancer ([place])    SKIN CANCERS   Cough    Probably related to ACE therapy. Stopped Lisinopril 06/2010.   Dyslipidemia    On Lipitor   Essential hypertension    On TOPROL XL. Controlled.   Heart palpitations    PT ON METOPROLOL   Hypercholesteremia    Insomnia, unspecified    Major depression    Pt continues to struggle - currently more anxiety than depression. Increasing Fluoxetine may aggrevate the RLS. She will increase 1 mg at a time.   Other and unspecified manifestations of thiamine deficiency    Pain in joint, site unspecified    Palpitations 2011   Historically c/w PSVT. Tachypalpitationsare associated with feeling of weakness and diaphoresis. Seen at [facility] on 05/13/10 to have significant hypertension & therapy was started. With better BP controll, episodes of tachypalps have resolved.   PONV (postoperative nausea and vomiting)    SEVERE!!   NO N&V AFTER UMBILICAL HERNIA REPAIR AT [facility]   Pure hypercholesterolemia    Pure hypercholesterolemia    RLS (restless legs syndrome)    Taking Requip at bedtime. Increasing Fluoxetine (for depression) may aggrevate the RLS. She will increase 1 mg at a time.   Sprain of ankle, left 10/2010   Dr. French Ana   Thyroiditis    Umbilical hernia    Had repair surgery but was complicated with infection so pt. had a second repair surgery  Past Surgical History:   Past Surgical History:  Procedure Laterality Date   [place] (for lazy eye) at age 60   [place] Right 0/4540   UMBILICAL HERNIA REPAIR     Complicated with infection 02/8118   UMBILICAL HERNIA REPAIR  07/2011   2nd surgery on umbilicus   VAGINAL WALL NODULES SURGICALLY REMOVED 1998     WISDOM TEETH EXTRACT 1996     WOUND DEBRIDEMENT  12/11/2011   Procedure: DEBRIDEMENT ABDOMINAL WOUND;  Surgeon: Earnstine Regal, MD;  Location: [facility];  Service: General;  Laterality: N/A;  Explore and debride abdominal wound     Past OB/GYN History: OB History  Gravida Para Term Preterm AB Living  1         1  SAB IAB Ectopic Multiple Live Births          1    # Outcome Date GA Lbr Len/2nd Weight Sex Delivery Anes PTL Lv  1 Gravida             Vaginal deliveries: 1, Menopausal: Yes, at age 51, Denies vaginal bleeding since menopause Last pap smear was 01/2021- negative.     Medications: She has a current medication list which includes the following prescription(s): buspirone, latuda, oxybutynin, trazodone, and vortioxetine hbr.   Allergies: Patient is allergic to penicillins, erythromycin, codeine, and lisinopril.   Social History:  Social History   Tobacco Use   Smoking status: Never   Smokeless tobacco: Never  Vaping Use   Vaping Use: Never used  Substance Use Topics   Alcohol use: No   Drug use: No    Relationship status: married She lives with husband.   She is employed  . Regular exercise: No History of abuse: No  Family History:   Family History  Problem Relation Age of Onset   Heart failure Mother    Heart disease Mother    CAD Mother 31   Osteoarthritis Mother    High Cholesterol Mother    Cancer Father        Non-small cell lung cancer   CAD Maternal Grandmother    Hypertension Maternal Grandmother    CAD Maternal Grandfather      Review of Systems: Review of Systems  Constitutional:  Negative for fever, malaise/fatigue and weight loss.  Respiratory:  Negative for cough, shortness of breath and wheezing.   Cardiovascular:  Negative for chest pain, palpitations and leg swelling.  Gastrointestinal:  Negative for abdominal pain and blood in stool.  Genitourinary:  Negative for dysuria.  Musculoskeletal:  Negative for myalgias.  Skin:  Negative for rash.  Neurological:  Negative for dizziness and headaches.  Endo/Heme/Allergies:  Does not bruise/bleed easily.        + hot flashes  Psychiatric/Behavioral:  Positive for depression. The patient is nervous/anxious.     OBJECTIVE Physical Exam: Vitals:   04/03/21 0844  BP: 139/85  Pulse: 90  Weight: 271 lb (122.9 kg)  Height: 5\' 2"  (1.575 m)    Physical Exam Constitutional:      General: She is not in acute distress.    Appearance: She is obese.  Pulmonary:     Effort: Pulmonary effort is normal.  Abdominal:     General: There is no distension.     Palpations: Abdomen is soft.     Tenderness: There is no  abdominal tenderness. There is no rebound.  Musculoskeletal:        General: No swelling. Normal range of motion.  Skin:    General: Skin is warm and dry.     Findings: No rash.  Neurological:     Mental Status: She is alert and oriented to person, place, and time.  Psychiatric:        Mood and Affect: Mood normal.        Behavior: Behavior normal.     GU / Detailed Urogynecologic Evaluation:  Pelvic Exam: Normal external female genitalia; Bartholin's and Skene's glands normal in appearance; urethral meatus normal in appearance, no urethral masses or discharge.   CST: negative  Speculum exam reveals normal vaginal mucosa without atrophy. Cervix normal appearance. Uterus normal single, nontender. Adnexa  not palpable .     Pelvic floor strength I/V  Pelvic floor musculature: Right levator non-tender, Right obturator non-tender, Left levator non-tender, Left obturator non-tender  POP-Q:   POP-Q  -3                                            Aa   -3                                           Ba  -6                                              C   3                                            Gh  4                                            Pb  9                                            tvl   -3                                            Ap  -3                                            Bp  -8.5                                              D    Rectal Exam:   Normal external rectum  Post-Void Residual (PVR) by Bladder Scan: In order to evaluate bladder emptying, we discussed obtaining a postvoid residual and she agreed to this procedure.  Procedure: The ultrasound unit was placed on the patient's abdomen in the suprapubic region after the patient had voided. A PVR of 88 ml was obtained by bladder scan.  Laboratory Results: POC urine: moderate blood and leukocytes  Straight catheterization performed in order to obtain more urine to send for UA micro and culture.   ASSESSMENT AND PLAN Ms. Babiarz is a 50 y.o. with:  1. Overactive bladder   2. Urinary frequency   3. SUI (stress urinary incontinence, female)   4. Hematuria, unspecified type    OAB - We discussed the symptoms of overactive bladder (OAB), which include urinary urgency, urinary frequency, nocturia, with or without urge incontinence.  While we do not know the exact etiology of OAB, several treatment options exist. We discussed management including behavioral therapy (decreasing bladder irritants, urge suppression strategies, timed voids, bladder retraining), physical therapy, medication. - Oxybutynin 5mg  XL ordered as this is the least expensive option with her insurance. For anticholinergic medications, we discussed the potential side effects of anticholinergics including dry eyes, dry mouth, constipation, cognitive impairment and urinary retention. - discussed reducing bladder irritants and drinking more water  2. SUI - For treatment of stress urinary incontinence,  non-surgical options include expectant management, weight loss, physical therapy, as well as a pessary.  We did not review surgical options.  - She will return for a pessary fitting  3. Hematuria - will send UA micro and culture to r/o hematuria or infection   Jaquita Folds, MD   Medical Decision Making:  - Reviewed/ ordered a clinical laboratory test - Review and summation of prior records

## 2021-04-03 ENCOUNTER — Ambulatory Visit: Payer: 59 | Admitting: Obstetrics and Gynecology

## 2021-04-03 ENCOUNTER — Other Ambulatory Visit: Payer: Self-pay

## 2021-04-03 ENCOUNTER — Encounter: Payer: Self-pay | Admitting: Obstetrics and Gynecology

## 2021-04-03 VITALS — BP 139/85 | HR 90 | Ht 62.0 in | Wt 271.0 lb

## 2021-04-03 DIAGNOSIS — N393 Stress incontinence (female) (male): Secondary | ICD-10-CM | POA: Diagnosis not present

## 2021-04-03 DIAGNOSIS — R319 Hematuria, unspecified: Secondary | ICD-10-CM

## 2021-04-03 DIAGNOSIS — N3281 Overactive bladder: Secondary | ICD-10-CM

## 2021-04-03 DIAGNOSIS — R350 Frequency of micturition: Secondary | ICD-10-CM | POA: Diagnosis not present

## 2021-04-03 LAB — POCT URINALYSIS DIPSTICK
Appearance: NORMAL
Bilirubin, UA: NEGATIVE
Glucose, UA: NEGATIVE
Ketones, UA: NEGATIVE
Nitrite, UA: NEGATIVE
Protein, UA: NEGATIVE
Spec Grav, UA: 1.02 (ref 1.010–1.025)
Urobilinogen, UA: 1 E.U./dL
pH, UA: 6 (ref 5.0–8.0)

## 2021-04-03 MED ORDER — OXYBUTYNIN CHLORIDE ER 5 MG PO TB24: 5.0000 mg | ORAL_TABLET | Freq: Every day | ORAL | 5 refills | Status: DC

## 2021-04-03 NOTE — Patient Instructions (Signed)
Today we talked about ways to manage bladder urgency such as altering your diet to avoid irritative beverages and foods (bladder diet) as well as attempting to decrease stress and other exacerbating factors.   Try to aim for 60 oz water per day.   The Most Bothersome Foods* The Least Bothersome Foods*  Coffee - Regular & Decaf Tea - caffeinated Carbonated beverages - cola, non-colas, diet & caffeine-free Alcohols - Beer, Red Wine, White Wine, Champagne Fruits - Grapefruit, [place], Orange, [employer] - Cranberry, Grapefruit, Orange, Pineapple Vegetables - Tomato & Tomato Products Flavor Enhancers - Hot peppers, Spicy foods, Chili, Horseradish, Vinegar, Monosodium glutamate (MSG) Artificial Sweeteners - NutraSweet, Sweet 'N Low, Equal (sweetener), Saccharin Ethnic foods - [place], [place], [place] food Express Scripts - low-fat & whole Fruits - Bananas, Blueberries, Honeydew melon, Pears, Raisins, Watermelon Vegetables - Broccoli, Brussels Sprouts, [place], Carrots, Cauliflower, [place], Cucumber, Mushrooms, Peas, Radishes, Squash, Zucchini, White potatoes, Sweet potatoes & yams Poultry - Chicken, Eggs, [place], [employer] - Beef, Programmer, multimedia, Lamb Seafood - Shrimp, [place] fish, Salmon Grains - Oat, Rice Snacks - Pretzels, Popcorn  *Lissa Morales et al. Diet and its role in interstitial cystitis/bladder pain syndrome (IC/BPS) and comorbid conditions. [place] 2012 Jan 11.

## 2021-04-04 LAB — URINALYSIS, MICROSCOPIC ONLY
Bacteria, UA: NONE SEEN
Casts: NONE SEEN /lpf
WBC, UA: NONE SEEN /hpf (ref 0–5)

## 2021-04-06 LAB — URINE CULTURE

## 2021-04-08 NOTE — Addendum Note (Signed)
Addended by: Jaquita Folds on: 04/08/2021 12:48 PM   Modules accepted: Orders

## 2021-04-18 ENCOUNTER — Other Ambulatory Visit: Payer: Self-pay

## 2021-04-18 ENCOUNTER — Ambulatory Visit (HOSPITAL_COMMUNITY)
Admission: RE | Admit: 2021-04-18 | Discharge: 2021-04-18 | Disposition: A | Discharge status: Home / Self Care | Payer: 59 | Source: Ambulatory Visit | Attending: Obstetrics and Gynecology | Admitting: Obstetrics and Gynecology

## 2021-04-18 DIAGNOSIS — R319 Hematuria, unspecified: Secondary | ICD-10-CM | POA: Diagnosis present

## 2021-04-25 ENCOUNTER — Encounter: Payer: Self-pay | Admitting: Obstetrics and Gynecology

## 2021-04-25 ENCOUNTER — Other Ambulatory Visit: Payer: Self-pay

## 2021-04-25 ENCOUNTER — Ambulatory Visit (INDEPENDENT_AMBULATORY_CARE_PROVIDER_SITE_OTHER): Payer: 59 | Admitting: Obstetrics and Gynecology

## 2021-04-25 VITALS — BP 156/79 | HR 92 | Wt 271.0 lb

## 2021-04-25 DIAGNOSIS — R3129 Other microscopic hematuria: Secondary | ICD-10-CM

## 2021-04-25 DIAGNOSIS — N393 Stress incontinence (female) (male): Secondary | ICD-10-CM

## 2021-04-25 DIAGNOSIS — N3281 Overactive bladder: Secondary | ICD-10-CM

## 2021-04-25 MED ORDER — SOLIFENACIN SUCCINATE 5 MG PO TABS: 5.0000 mg | ORAL_TABLET | Freq: Every day | ORAL | 5 refills | Status: DC

## 2021-04-25 NOTE — Progress Notes (Signed)
[facility]   Subjective:     Chief Complaint: No chief complaint on file.  History of Present Illness: Amanda Baker is a 50 y.o. female with stress incontinence and OAB who presents today for a pessary fitting.   Has been on oxybutynin for her OAB symptoms. It is not helping all that much, maybe has noticed slightly less urgency. Has been on it for about a month. Denies any dry mouth, dry eyes or constipation. Noted to have microscopic hematuria at last visit and was ordered for a renal US which was normal. Has upcoming cystoscopy in the office.   Past Medical History: Patient  has a past medical history of Acute thyroiditis (2004), Anxiety state, unspecified, Bladder disorder, Cancer ([place]), Cough, Dyslipidemia, Essential hypertension, Heart palpitations, Hypercholesteremia, Insomnia, unspecified, Major depression, Other and unspecified manifestations of thiamine deficiency, Pain in joint, site unspecified, Palpitations (2011), PONV (postoperative nausea and vomiting), Pure hypercholesterolemia, Pure hypercholesterolemia, RLS (restless legs syndrome), Sprain of ankle, left (10/2010), Thyroiditis, and Umbilical hernia.   Past Surgical History: She  has a past surgical history that includes Gallbladder surgery; Cholecystectomy (1995); Umbilical hernia repair; Eye surgery (1979); Tonsillectomy (1989); WISDOM TEETH EXTRACT 1996; VAGINAL WALL NODULES SURGICALLY REMOVED 1998; Wound debridement (12/11/2011); Umbilical hernia repair (07/2011); and Tympanostomy tube placement (Right, 06/2012).   Medications: She has a current medication list which includes the following prescription(s): buspirone, estradiol, latuda, oxybutynin, progesterone, trazodone, and vortioxetine hbr.   Allergies: Patient is allergic to penicillins, erythromycin, codeine, and lisinopril.   Social History: Patient  reports that she has never smoked. She has never used smokeless tobacco. She reports that she does  not drink alcohol and does not use drugs.      Objective:    BP (!) 156/79   Pulse 92   Wt 271 lb (122.9 kg)   BMI 49.57 kg/m  Gen: No apparent distress, A&O x 3. Pelvic Exam: Normal external female genitalia; Bartholin's and Skene's glands normal in appearance; urethral meatus normal in appearance, no urethral masses or discharge.   A size #0 incontinence dish was placed. This fit well but was uncomfortable with walking around. A#0 incontinence ring pessary was fitted. It was comfortable, stayed in place with valsalva and was an appropriate size on examination, with one finger fitting between the pessary and the vaginal walls.   POP-Q:    POP-Q   -3                                            Aa   -3                                           Ba   -6                                              C    3                                            Gh   4  Pb   9                                            tvl    -3                                            Ap   -3                                            Bp   -8.5                                              D     Assessment/Plan:    Assessment: Amanda Baker is a 50 y.o. with stress incontinence and OAB who presents for a pessary fitting.  Plan:  SUI - She was fitted with a #0 incontinence ring pessary. She will keep the pessary in place until next visit.   OAB - will stop oxybutynin and change to vesicare 5mg  daily.   Hematuria - Reviewed that renal US returned normal - Has cystoscopy scheduled for next week  All questions were answered.    Amanda Folds, MD   Time spent: I spent 20 minutes dedicated to the care of this patient on the date of this encounter to include pre-visit review of records, face-to-face time with the patient discussing and post visit documentation and ordering medication/ testing. Additional time was spent on the procedure.

## 2021-05-03 ENCOUNTER — Ambulatory Visit (INDEPENDENT_AMBULATORY_CARE_PROVIDER_SITE_OTHER): Payer: 59 | Admitting: Obstetrics and Gynecology

## 2021-05-03 ENCOUNTER — Other Ambulatory Visit: Payer: Self-pay

## 2021-05-03 VITALS — BP 123/77 | HR 94 | Ht 62.0 in | Wt 271.0 lb

## 2021-05-03 DIAGNOSIS — R350 Frequency of micturition: Secondary | ICD-10-CM

## 2021-05-03 DIAGNOSIS — N393 Stress incontinence (female) (male): Secondary | ICD-10-CM

## 2021-05-03 DIAGNOSIS — R319 Hematuria, unspecified: Secondary | ICD-10-CM

## 2021-05-03 LAB — POCT URINALYSIS DIPSTICK
Appearance: NORMAL
Bilirubin, UA: NEGATIVE
Glucose, UA: NEGATIVE
Ketones, UA: NEGATIVE
Nitrite, UA: NEGATIVE
Protein, UA: NEGATIVE
Spec Grav, UA: 1.025 (ref 1.010–1.025)
Urobilinogen, UA: 0.2 E.U./dL
pH, UA: 6.5 (ref 5.0–8.0)

## 2021-05-03 NOTE — Patient Instructions (Signed)

## 2021-05-03 NOTE — Progress Notes (Signed)
CYSTOSCOPY  CC:  This is a 50 y.o. with microscopic hematuria who presents today for cystoscopy.  POC urine: moderate blood,   BP 123/77   Pulse 94   Ht 5\' 2"  (1.575 m)   Wt 271 lb (122.9 kg)   BMI 49.57 kg/m   CYSTOSCOPY: A time out was performed.  The periurethral area was prepped and draped in a sterile manner.  2% lidocaine jetpack was inserted at the urethral meatus and the urethra and bladder visualized with 0- and 70-degree scopes.  She had normal urethral coaptation and normal urethral mucosa.  She had normal bladder mucosa. She had bilateral clear efflux from both ureteral orifices.  She had no squamous metaplasia at the trigone, no trabeculations, cellules or diverticuli.     ASSESSMENT:  50 y.o. with microscopic hematuria. Cystoscopy today is normal.  PLAN:  Plan to follow up with repeat UA in 1 year Had normal renal US  All questions answered and post-procedures instructions were given  Jaquita Folds, MD

## 2021-05-03 NOTE — Progress Notes (Addendum)
[facility]   Subjective:     Chief Complaint: cystoscopy  History of Present Illness: Amanda Baker is a 50 y.o. female with stress incontinence who presents today for a pessary fitting.   Last pessary (#0 incontinence ring) fell out after about a day. She had a BM and it moved around then came out next time she voided.  Past Medical History: Patient  has a past medical history of Acute thyroiditis (2004), Anxiety state, unspecified, Bladder disorder, Cancer ([place]), Cough, Dyslipidemia, Essential hypertension, Heart palpitations, Hypercholesteremia, Insomnia, unspecified, Major depression, Other and unspecified manifestations of thiamine deficiency, Pain in joint, site unspecified, Palpitations (2011), PONV (postoperative nausea and vomiting), Pure hypercholesterolemia, Pure hypercholesterolemia, RLS (restless legs syndrome), Sprain of ankle, left (10/2010), Thyroiditis, and Umbilical hernia.   Past Surgical History: She  has a past surgical history that includes Gallbladder surgery; Cholecystectomy (1995); Umbilical hernia repair; Eye surgery (1979); Tonsillectomy (1989); WISDOM TEETH EXTRACT 1996; VAGINAL WALL NODULES SURGICALLY REMOVED 1998; Wound debridement (12/11/2011); Umbilical hernia repair (07/2011); and Tympanostomy tube placement (Right, 06/2012).   Medications: She has a current medication list which includes the following prescription(s): buspirone, estradiol, latuda, progesterone, solifenacin, trazodone, and vortioxetine hbr.   Allergies: Patient is allergic to penicillins, erythromycin, codeine, and lisinopril.   Social History: Patient  reports that she has never smoked. She has never used smokeless tobacco. She reports that she does not drink alcohol and does not use drugs.      Objective:    BP 123/77   Pulse 94   Ht 5\' 2"  (1.575 m)   Wt 271 lb (122.9 kg)   BMI 49.57 kg/m  Gen: No apparent distress, A&O x 3. Pelvic Exam: Normal external female  genitalia; Bartholin's and Skene's glands normal in appearance; urethral meatus normal in appearance, no urethral masses or discharge.   A size 2-3/4 x1-3/4in  hodge pessary was fitted (Lot [id number], Exp 01/03/28). It was comfortable, stayed in place with valsalva and was an appropriate size on examination, with one finger fitting between the pessary and the vaginal walls.   POP-Q (04/03/21):  POP-Q    -3                                            Aa   -3                                           Ba   -6                                              C    3                                            Gh   4                                            Pb  9                                            tvl    -3                                            Ap   -3                                            Bp   -8.5                                              D      Assessment/Plan:    Assessment: [name] is a 50 y.o. with stress incontinence who presents for a pessary fitting.  Plan: She was fitted with a 2-3/4 x1-3/4in hodge pessary. She will keep the pessary in place until next visit. She would eventually like to learn to remove/ replace the pessary.   Follow-up in 3 weeks for a pessary check or sooner as needed.  All questions were answered.    Jaquita Folds, MD

## 2021-05-29 ENCOUNTER — Ambulatory Visit: Payer: 59 | Admitting: Obstetrics and Gynecology

## 2021-06-05 ENCOUNTER — Ambulatory Visit (INDEPENDENT_AMBULATORY_CARE_PROVIDER_SITE_OTHER): Payer: 59 | Admitting: Obstetrics and Gynecology

## 2021-06-05 ENCOUNTER — Encounter: Payer: Self-pay | Admitting: Obstetrics and Gynecology

## 2021-06-05 ENCOUNTER — Other Ambulatory Visit: Payer: Self-pay

## 2021-06-05 VITALS — BP 139/82 | HR 93

## 2021-06-05 DIAGNOSIS — N3281 Overactive bladder: Secondary | ICD-10-CM

## 2021-06-05 DIAGNOSIS — N393 Stress incontinence (female) (male): Secondary | ICD-10-CM

## 2021-06-05 NOTE — Progress Notes (Signed)
[facility]   Subjective:     Chief Complaint:  Chief Complaint  Patient presents with   Pessary Check   History of Present Illness: Amanda Baker is a 50 y.o. female with stress incontinence and OAB who presents for a pessary check. She is using a size 2-3/4 x1-3/4in hodge pessary (expelled incontinence ring). Currently taking vesicare 5mg  daily for OAB symptoms.   Feels that her incontinence symptoms are 80% better. She is still leaking with sneezing/ laughing. Otherwise urgency has decreased and she is able to get to the bathroom easier.   Hematuria workup negative.   Past Medical History: Patient  has a past medical history of Acute thyroiditis (2004), Anxiety state, unspecified, Bladder disorder, Cancer ([place]), Cough, Dyslipidemia, Essential hypertension, Heart palpitations, Hypercholesteremia, Insomnia, unspecified, Major depression, Other and unspecified manifestations of thiamine deficiency, Pain in joint, site unspecified, Palpitations (2011), PONV (postoperative nausea and vomiting), Pure hypercholesterolemia, Pure hypercholesterolemia, RLS (restless legs syndrome), Sprain of ankle, left (10/2010), Thyroiditis, and Umbilical hernia.   Past Surgical History: She  has a past surgical history that includes Gallbladder surgery; Cholecystectomy (1995); Umbilical hernia repair; Eye surgery (1979); Tonsillectomy (1989); WISDOM TEETH EXTRACT 1996; VAGINAL WALL NODULES SURGICALLY REMOVED 1998; Wound debridement (12/11/2011); Umbilical hernia repair (07/2011); and Tympanostomy tube placement (Right, 06/2012).   Medications: She has a current medication list which includes the following prescription(s): buspirone, estradiol, latuda, progesterone, solifenacin, trazodone, and vortioxetine hbr.   Allergies: Patient is allergic to penicillins, erythromycin, codeine, and lisinopril.   Social History: Patient  reports that she has never smoked. She has never used smokeless  tobacco. She reports that she does not drink alcohol and does not use drugs.      Objective:    Physical Exam: BP 139/82    Pulse 93  Gen: No apparent distress, A&O x 3. Detailed Urogynecologic Evaluation:  Pelvic Exam: Normal external female genitalia; Bartholin's and Skene's glands normal in appearance; urethral meatus normal in appearance, no urethral masses or discharge. The pessary was noted to be in place. It was removed and cleaned. Speculum exam revealed no lesions in the vagina. The pessary was left out.   POP-Q (04/03/21):    POP-Q   -3                                            Aa   -3                                           Ba   -6                                              C    3                                            Gh   4  Pb   9                                            tvl    -3                                            Ap   -3                                            Bp   -8.5                                              D        Assessment/Plan:    Assessment: [name] is a 50 y.o. with stress incontinence and OAB .  Plan:  OAB - continue with vesicare 5mg   SUI - pessary removed since it is not helping her symptoms  - Surgical options include a midurethral sling, and transurethral injection of a bulking agent. She is interested in a sling. Handout provided on this option.  -Will have her return for simple CMG testing to demonstrate SUI. She has done urodynamics in the past and had significant anxiety with the procedure. I explained that this is a shortened version of that test and she is comfortable with proceeding.   Jaquita Folds, MD   Time Spent: Time spent: I spent 25 minutes dedicated to the care of this patient on the date of this encounter to include pre-visit review of records, face-to-face time with the patient and post visit documentation.

## 2021-06-06 ENCOUNTER — Encounter: Payer: Self-pay | Admitting: Obstetrics and Gynecology

## 2021-07-05 ENCOUNTER — Other Ambulatory Visit: Payer: Self-pay

## 2021-07-05 ENCOUNTER — Ambulatory Visit (INDEPENDENT_AMBULATORY_CARE_PROVIDER_SITE_OTHER): Payer: 59 | Admitting: Obstetrics and Gynecology

## 2021-07-05 ENCOUNTER — Encounter: Payer: Self-pay | Admitting: Obstetrics and Gynecology

## 2021-07-05 VITALS — BP 147/89 | HR 95

## 2021-07-05 DIAGNOSIS — N3281 Overactive bladder: Secondary | ICD-10-CM

## 2021-07-05 DIAGNOSIS — N393 Stress incontinence (female) (male): Secondary | ICD-10-CM | POA: Diagnosis not present

## 2021-07-05 DIAGNOSIS — N952 Postmenopausal atrophic vaginitis: Secondary | ICD-10-CM

## 2021-07-05 MED ORDER — ESTRADIOL 0.1 MG/GM VA CREA: TOPICAL_CREAM | VAGINAL | 11 refills | Status: DC

## 2021-07-05 NOTE — Patient Instructions (Signed)

## 2021-07-05 NOTE — Progress Notes (Signed)
[facility] Return Visit  SUBJECTIVE  History of Present Illness: Amanda Baker is a 51 y.o. female seen in follow-up for mixed incontinence. She has started on the solifenacin 5mg  and this has helped with her waking to urinate at night. She is still noticing leakage with movement at night, and with cough and sneeze during the day.     Past Medical History: Patient  has a past medical history of Acute thyroiditis (2004), Anxiety state, unspecified, Bladder disorder, Cancer ([place]), Cough, Dyslipidemia, Essential hypertension, Heart palpitations, Hypercholesteremia, Insomnia, unspecified, Major depression, Other and unspecified manifestations of thiamine deficiency, Pain in joint, site unspecified, Palpitations (2011), PONV (postoperative nausea and vomiting), Pure hypercholesterolemia, Pure hypercholesterolemia, RLS (restless legs syndrome), Sprain of ankle, left (10/2010), Thyroiditis, and Umbilical hernia.   Past Surgical History: She  has a past surgical history that includes Gallbladder surgery; Cholecystectomy (1995); Umbilical hernia repair; Eye surgery (1979); Tonsillectomy (1989); WISDOM TEETH EXTRACT 1996; VAGINAL WALL NODULES SURGICALLY REMOVED 1998; Wound debridement (12/11/2011); Umbilical hernia repair (07/2011); and Tympanostomy tube placement (Right, 06/2012).   Medications: She has a current medication list which includes the following prescription(s): buspirone, estradiol, latuda, progesterone, solifenacin, trazodone, and vortioxetine hbr.   Allergies: Patient is allergic to penicillins, erythromycin, codeine, and lisinopril.   Social History: Patient  reports that she has never smoked. She has never used smokeless tobacco. She reports that she does not drink alcohol and does not use drugs.      OBJECTIVE     Physical Exam: Vitals:   07/05/21 0818  BP: (!) 147/89  Pulse: 95   Gen: No apparent distress, A&O x 3.  Detailed Urogynecologic Evaluation:   Deferred. Prior exam showed:  POP-Q (04/03/21):    POP-Q   -3                                            Aa   -3                                           Ba   -6                                              C    3                                            Gh   4                                            Pb   9                                            tvl    -3  Ap   -3                                            Bp   -8.5                                              D          Verbal consent was obtained to perform simple CMG procedure:   Urethra was prepped with betadine and a 5F catheter was placed and bladder was drained completely. The bladder was then backfilled with sterile water by gravity.  First sensation: 90 First Desire: 110 Strong Desire: 200 Capacity: 375 Cough stress test was positive. Valsalva stress test was not done.  Catheter was inserted to drain the bladder.   Interpretation: CMG showed normal sensation, and normal cystometric capacity. Findings positive for stress incontinence, negative for detrusor overactivity.     ASSESSMENT AND PLAN    [name] is a 51 y.o. with:  No diagnosis found.  Plan for surgery: Exam under anesthesia, midurethral sling, cystoscopy  - We reviewed the patient's specific anatomic and functional findings, with the assistance of diagrams, and together finalized the above procedure. The planned surgical procedures were discussed along with the surgical risks outlined below, which were also provided on a detailed handout. Additional treatment options including expectant management, conservative management, medical management were discussed where appropriate.  We reviewed the benefits and risks of each treatment option. We reviewed that the sling will not treat her OAB symptoms and she will need to continue with the medication.   General Surgical Risks: For all  procedures, there are risks of bleeding, infection, damage to surrounding organs including but not limited to bowel, bladder, blood vessels, ureters and nerves, and need for further surgery if an injury were to occur. These risks are all low with minimally invasive surgery.  Very rare risks include blood transfusion, blood clot, heart attack, pneumonia, or death.   There is also a risk of short-term postoperative urinary retention with need to use a catheter. About half of patients need to go home from surgery with a catheter, which is then later removed in the office. The risk of long-term need for a catheter is very low. There is also a risk of worsening of overactive bladder.   Sling: The effectiveness of a midurethral vaginal mesh sling is approximately 85%, and thus, there will be times when you may leak urine after surgery, especially if your bladder is full or if you have a strong cough. There is a balance between making the sling tight enough to treat your leakage but not too tight so that you have long-term difficulty emptying your bladder. A mesh sling will not directly treat overactive bladder/urge incontinence and may worsen it.  There is an FDA safety notification on vaginal mesh procedures for prolapse but NOT mesh slings. We have extensive experience and training with mesh placement and we have close postoperative follow up to identify any potential complications from mesh. It is important to realize that this mesh is a permanent implant that cannot be easily removed. There are rare risks of mesh exposure (2-4%), pain with intercourse (0-7%), and infection (<1%). The risk of mesh exposure if more likely in  a woman with risks for poor healing (prior radiation, poorly controlled diabetes, or immunocompromised). The risk of new or worsened chronic pain after mesh implant is more common in women with baseline chronic pain and/or poorly controlled anxiety or depression. Approximately 2-4% of patients  will experience longer-term post-operative voiding dysfunction that may require surgical revision of the sling. We also reviewed that postoperatively, her stream may not be as strong as before surgery.    - For preop Visit:  She is required to have a visit within 30 days of her surgery.   Today we reviewed pre-operative preparation, peri-operative expectations, and post-operative instructions/recovery.  She was provided with instructional handouts. Prescriptions will be provided for: Oxycodone 5mg  , Ibuprofen 600mg  , Tylenol 500mg  , Miralax. These prescriptions will be sent prior to surgery.  She was also provided with a prescription for vaginal estrogen: Use estrace cream 0.5 nightly for two weeks and twice weekly after to help with vaginal atrophy.   - Medical clearance: not required  - Anticoagulant use: No - Medicaid Hysterectomy form: No - Accepts blood transfusion: Yes - Expected length of stay: outpatient  Request sent for surgery scheduling.   Jaquita Folds, MD   Time spent: I spent 35 minutes dedicated to the care of this patient on the date of this encounter to include pre-visit review of records, face-to-face time with the patient and post visit documentation and ordering medication/ testing. Additional time was spent on the procedure.

## 2021-07-15 ENCOUNTER — Encounter (HOSPITAL_BASED_OUTPATIENT_CLINIC_OR_DEPARTMENT_OTHER): Payer: Self-pay | Admitting: Obstetrics and Gynecology

## 2021-07-15 ENCOUNTER — Other Ambulatory Visit: Payer: Self-pay

## 2021-07-15 ENCOUNTER — Other Ambulatory Visit: Payer: Self-pay | Admitting: Obstetrics and Gynecology

## 2021-07-15 DIAGNOSIS — Z01818 Encounter for other preprocedural examination: Secondary | ICD-10-CM

## 2021-07-15 MED ORDER — OXYCODONE HCL 5 MG PO TABS: 5.0000 mg | ORAL_TABLET | ORAL | 0 refills | Status: DC | PRN

## 2021-07-15 MED ORDER — POLYETHYLENE GLYCOL 3350 17 GM/SCOOP PO POWD: 17.0000 g | Freq: Every day | ORAL | 0 refills | Status: DC

## 2021-07-15 MED ORDER — ACETAMINOPHEN 500 MG PO TABS: 500.0000 mg | ORAL_TABLET | Freq: Four times a day (QID) | ORAL | 0 refills | Status: DC | PRN

## 2021-07-15 MED ORDER — IBUPROFEN 600 MG PO TABS: 600.0000 mg | ORAL_TABLET | Freq: Four times a day (QID) | ORAL | 0 refills | Status: DC | PRN

## 2021-07-15 NOTE — Progress Notes (Signed)
Spoke w/ via phone for pre-op interview---pt Lab needs dos----   I stat (gent), t & s, urine poct preg            Lab results------none COVID test -----patient states asymptomatic no test needed Arrive at -------1000 am 07-18-2021 NPO after MN NO Solid Food.  Clear liquids from MN until---900 am Med rec completed Medications to take morning of surgery -----Vesicare, Buspirone, Trinetellix, estradiol patch Diabetic medication -----n/a Patient instructed no nail polish to be worn day of surgery Patient instructed to bring photo id and insurance card day of surgery Patient aware to have Driver (ride ) / caregiver    for 24 hours after surgery husband david Patient Special Instructions -----bring cpap mask tubing and machine and leave in car Pre-Op special Istructions -----none Patient verbalized understanding of instructions that were given at this phone interview. Patient denies shortness of breath, chest pain, fever, cough at this phone interview.

## 2021-07-15 NOTE — Progress Notes (Signed)
Pt was called and notified of her medication for surgery has been sent to the pharmacy.

## 2021-07-18 ENCOUNTER — Ambulatory Visit (HOSPITAL_BASED_OUTPATIENT_CLINIC_OR_DEPARTMENT_OTHER): Payer: 59 | Admitting: Anesthesiology

## 2021-07-18 ENCOUNTER — Ambulatory Visit (HOSPITAL_BASED_OUTPATIENT_CLINIC_OR_DEPARTMENT_OTHER)
Admission: RE | Admit: 2021-07-18 | Discharge: 2021-07-18 | Disposition: A | Discharge status: Home / Self Care | Payer: 59 | Attending: Obstetrics and Gynecology | Admitting: Obstetrics and Gynecology

## 2021-07-18 ENCOUNTER — Encounter (HOSPITAL_BASED_OUTPATIENT_CLINIC_OR_DEPARTMENT_OTHER): Payer: Self-pay | Admitting: Obstetrics and Gynecology

## 2021-07-18 ENCOUNTER — Other Ambulatory Visit: Payer: Self-pay

## 2021-07-18 ENCOUNTER — Telehealth: Payer: Self-pay | Admitting: Obstetrics and Gynecology

## 2021-07-18 ENCOUNTER — Encounter (HOSPITAL_BASED_OUTPATIENT_CLINIC_OR_DEPARTMENT_OTHER)
Admission: RE | Disposition: A | Discharge status: Home / Self Care | Payer: Self-pay | Source: Home / Self Care | Attending: Obstetrics and Gynecology

## 2021-07-18 DIAGNOSIS — Z6841 Body Mass Index (BMI) 40.0 and over, adult: Secondary | ICD-10-CM | POA: Diagnosis not present

## 2021-07-18 DIAGNOSIS — I10 Essential (primary) hypertension: Secondary | ICD-10-CM | POA: Insufficient documentation

## 2021-07-18 DIAGNOSIS — N393 Stress incontinence (female) (male): Secondary | ICD-10-CM

## 2021-07-18 DIAGNOSIS — G4730 Sleep apnea, unspecified: Secondary | ICD-10-CM | POA: Insufficient documentation

## 2021-07-18 HISTORY — PX: BLADDER SUSPENSION: SHX72

## 2021-07-18 HISTORY — DX: Presence of spectacles and contact lenses: Z97.3

## 2021-07-18 HISTORY — DX: Sleep apnea, unspecified: G47.30

## 2021-07-18 HISTORY — DX: Personal history of urinary calculi: Z87.442

## 2021-07-18 HISTORY — PX: CYSTOSCOPY: SHX5120

## 2021-07-18 LAB — TYPE AND SCREEN
ABO/RH(D): O POS
Antibody Screen: NEGATIVE

## 2021-07-18 LAB — POCT I-STAT, CHEM 8
BUN: 12 mg/dL (ref 6–20)
Calcium, Ion: 1.19 mmol/L (ref 1.15–1.40)
Chloride: 102 mmol/L (ref 98–111)
Creatinine, Ser: 0.6 mg/dL (ref 0.44–1.00)
Glucose, Bld: 105 mg/dL — ABNORMAL HIGH (ref 70–99)
HCT: 42 % (ref 36.0–46.0)
Hemoglobin: 14.3 g/dL (ref 12.0–15.0)
Potassium: 4 mmol/L (ref 3.5–5.1)
Sodium: 139 mmol/L (ref 135–145)
TCO2: 26 mmol/L (ref 22–32)

## 2021-07-18 LAB — ABO/RH: ABO/RH(D): O POS

## 2021-07-18 SURGERY — URETHROPEXY, USING TRANSVAGINAL TAPE
Anesthesia: General | Site: Vagina

## 2021-07-18 MED ORDER — ACETAMINOPHEN 500 MG PO TABS
500.0000 mg | ORAL_TABLET | Freq: Four times a day (QID) | ORAL | Status: DC | PRN
  Administered 2021-07-18: 500 mg via ORAL

## 2021-07-18 MED ORDER — ACETAMINOPHEN 500 MG PO TABS
1000.0000 mg | ORAL_TABLET | ORAL | Status: AC
  Administered 2021-07-18: 1000 mg via ORAL

## 2021-07-18 MED ORDER — DEXAMETHASONE SODIUM PHOSPHATE 10 MG/ML IJ SOLN
INTRAMUSCULAR | Status: AC
  Filled 2021-07-18: qty 1

## 2021-07-18 MED ORDER — OXYCODONE HCL 5 MG PO TABS
5.0000 mg | ORAL_TABLET | Freq: Once | ORAL | Status: AC
  Administered 2021-07-18: 5 mg via ORAL

## 2021-07-18 MED ORDER — ONDANSETRON HCL 4 MG/2ML IJ SOLN
INTRAMUSCULAR | Status: DC | PRN
  Administered 2021-07-18: 4 mg via INTRAVENOUS

## 2021-07-18 MED ORDER — SUGAMMADEX SODIUM 200 MG/2ML IV SOLN
INTRAVENOUS | Status: DC | PRN
  Administered 2021-07-18: 300 mg via INTRAVENOUS

## 2021-07-18 MED ORDER — GABAPENTIN 300 MG PO CAPS
ORAL_CAPSULE | ORAL | Status: AC
  Filled 2021-07-18: qty 1

## 2021-07-18 MED ORDER — ROCURONIUM BROMIDE 10 MG/ML (PF) SYRINGE
PREFILLED_SYRINGE | INTRAVENOUS | Status: DC | PRN
  Administered 2021-07-18: 40 mg via INTRAVENOUS

## 2021-07-18 MED ORDER — MIDAZOLAM HCL 2 MG/2ML IJ SOLN
INTRAMUSCULAR | Status: AC
  Filled 2021-07-18: qty 2

## 2021-07-18 MED ORDER — ONDANSETRON HCL 4 MG/2ML IJ SOLN
INTRAMUSCULAR | Status: AC
  Filled 2021-07-18: qty 2

## 2021-07-18 MED ORDER — OXYCODONE HCL 5 MG PO TABS
ORAL_TABLET | ORAL | Status: AC
  Filled 2021-07-18: qty 1

## 2021-07-18 MED ORDER — PHENYLEPHRINE 40 MCG/ML (10ML) SYRINGE FOR IV PUSH (FOR BLOOD PRESSURE SUPPORT)
PREFILLED_SYRINGE | INTRAVENOUS | Status: DC | PRN
  Administered 2021-07-18 (×3): 40 ug via INTRAVENOUS

## 2021-07-18 MED ORDER — CLINDAMYCIN PHOSPHATE 900 MG/50ML IV SOLN
INTRAVENOUS | Status: AC
  Filled 2021-07-18: qty 50

## 2021-07-18 MED ORDER — AMISULPRIDE (ANTIEMETIC) 5 MG/2ML IV SOLN: 10.0000 mg | Freq: Once | INTRAVENOUS | Status: DC | PRN

## 2021-07-18 MED ORDER — SCOPOLAMINE 1 MG/3DAYS TD PT72
MEDICATED_PATCH | TRANSDERMAL | Status: AC
  Filled 2021-07-18: qty 1

## 2021-07-18 MED ORDER — MIDAZOLAM HCL 2 MG/2ML IJ SOLN
INTRAMUSCULAR | Status: DC | PRN
  Administered 2021-07-18: 2 mg via INTRAVENOUS

## 2021-07-18 MED ORDER — FENTANYL CITRATE (PF) 100 MCG/2ML IJ SOLN
INTRAMUSCULAR | Status: AC
  Filled 2021-07-18: qty 2

## 2021-07-18 MED ORDER — DEXAMETHASONE SODIUM PHOSPHATE 10 MG/ML IJ SOLN
INTRAMUSCULAR | Status: DC | PRN
  Administered 2021-07-18 (×2): 5 mg via INTRAVENOUS

## 2021-07-18 MED ORDER — PHENYLEPHRINE 40 MCG/ML (10ML) SYRINGE FOR IV PUSH (FOR BLOOD PRESSURE SUPPORT)
PREFILLED_SYRINGE | INTRAVENOUS | Status: AC
  Filled 2021-07-18: qty 10

## 2021-07-18 MED ORDER — 0.9 % SODIUM CHLORIDE (POUR BTL) OPTIME
TOPICAL | Status: DC | PRN
  Administered 2021-07-18: 500 mL

## 2021-07-18 MED ORDER — PROPOFOL 500 MG/50ML IV EMUL
INTRAVENOUS | Status: DC | PRN
  Administered 2021-07-18: 200 ug/kg/min via INTRAVENOUS

## 2021-07-18 MED ORDER — PROPOFOL 10 MG/ML IV BOLUS
INTRAVENOUS | Status: DC | PRN
  Administered 2021-07-18: 200 mg via INTRAVENOUS

## 2021-07-18 MED ORDER — KETOROLAC TROMETHAMINE 30 MG/ML IJ SOLN
INTRAMUSCULAR | Status: AC
  Filled 2021-07-18: qty 1

## 2021-07-18 MED ORDER — NYSTATIN 100000 UNIT/GM EX POWD: 1.0000 "application " | Freq: Three times a day (TID) | CUTANEOUS | 5 refills | Status: AC

## 2021-07-18 MED ORDER — ACETAMINOPHEN 500 MG PO TABS
ORAL_TABLET | ORAL | Status: AC
  Filled 2021-07-18: qty 1

## 2021-07-18 MED ORDER — SODIUM CHLORIDE 0.9 % IR SOLN
Status: DC | PRN
  Administered 2021-07-18: 300 mL via INTRAVESICAL

## 2021-07-18 MED ORDER — FENTANYL CITRATE (PF) 100 MCG/2ML IJ SOLN
25.0000 ug | INTRAMUSCULAR | Status: DC | PRN
  Administered 2021-07-18 (×2): 25 ug via INTRAVENOUS

## 2021-07-18 MED ORDER — FENTANYL CITRATE (PF) 100 MCG/2ML IJ SOLN
INTRAMUSCULAR | Status: DC | PRN
  Administered 2021-07-18 (×4): 50 ug via INTRAVENOUS

## 2021-07-18 MED ORDER — ACETAMINOPHEN 500 MG PO TABS
ORAL_TABLET | ORAL | Status: AC
  Filled 2021-07-18: qty 2

## 2021-07-18 MED ORDER — PHENAZOPYRIDINE HCL 100 MG PO TABS
ORAL_TABLET | ORAL | Status: AC
  Filled 2021-07-18: qty 1

## 2021-07-18 MED ORDER — SCOPOLAMINE 1 MG/3DAYS TD PT72
1.0000 | MEDICATED_PATCH | TRANSDERMAL | Status: DC
  Administered 2021-07-18: 1.5 mg via TRANSDERMAL

## 2021-07-18 MED ORDER — CLINDAMYCIN PHOSPHATE 900 MG/50ML IV SOLN
900.0000 mg | INTRAVENOUS | Status: AC
  Administered 2021-07-18: 900 mg via INTRAVENOUS

## 2021-07-18 MED ORDER — PROMETHAZINE HCL 25 MG/ML IJ SOLN: 6.2500 mg | INTRAMUSCULAR | Status: DC | PRN

## 2021-07-18 MED ORDER — GENTAMICIN SULFATE 40 MG/ML IJ SOLN
5.0000 mg/kg | INTRAVENOUS | Status: AC
  Administered 2021-07-18: 398 mg via INTRAVENOUS
  Filled 2021-07-18: qty 9.75

## 2021-07-18 MED ORDER — LACTATED RINGERS IV SOLN: INTRAVENOUS | Status: DC

## 2021-07-18 MED ORDER — POVIDONE-IODINE 10 % EX SWAB: 2.0000 "application " | Freq: Once | CUTANEOUS | Status: DC

## 2021-07-18 MED ORDER — PROPOFOL 10 MG/ML IV BOLUS
INTRAVENOUS | Status: AC
  Filled 2021-07-18: qty 20

## 2021-07-18 MED ORDER — LIDOCAINE-EPINEPHRINE 1 %-1:100000 IJ SOLN
INTRAMUSCULAR | Status: DC | PRN
  Administered 2021-07-18: 7 mL

## 2021-07-18 MED ORDER — PHENAZOPYRIDINE HCL 100 MG PO TABS
ORAL_TABLET | ORAL | Status: AC
  Filled 2021-07-18: qty 2

## 2021-07-18 MED ORDER — GABAPENTIN 300 MG PO CAPS
300.0000 mg | ORAL_CAPSULE | ORAL | Status: AC
  Administered 2021-07-18: 300 mg via ORAL

## 2021-07-18 MED ORDER — SURGIFLO WITH THROMBIN (HEMOSTATIC MATRIX KIT) OPTIME
TOPICAL | Status: DC | PRN
  Administered 2021-07-18: 1 via TOPICAL

## 2021-07-18 MED ORDER — LIDOCAINE 2% (20 MG/ML) 5 ML SYRINGE
INTRAMUSCULAR | Status: DC | PRN
  Administered 2021-07-18: 60 mg via INTRAVENOUS

## 2021-07-18 MED ORDER — KETOROLAC TROMETHAMINE 30 MG/ML IJ SOLN
30.0000 mg | Freq: Once | INTRAMUSCULAR | Status: AC | PRN
  Administered 2021-07-18: 30 mg via INTRAVENOUS

## 2021-07-18 MED ORDER — SUGAMMADEX SODIUM 500 MG/5ML IV SOLN
INTRAVENOUS | Status: AC
  Filled 2021-07-18: qty 5

## 2021-07-18 MED ORDER — WHITE PETROLATUM EX OINT
TOPICAL_OINTMENT | CUTANEOUS | Status: AC
  Filled 2021-07-18: qty 5

## 2021-07-18 MED ORDER — PHENAZOPYRIDINE HCL 100 MG PO TABS
200.0000 mg | ORAL_TABLET | ORAL | Status: AC
  Administered 2021-07-18: 200 mg via ORAL

## 2021-07-18 SURGICAL SUPPLY — 43 items
ADH SKN CLS APL DERMABOND .7 (GAUZE/BANDAGES/DRESSINGS) ×2
AGENT HMST KT MTR STRL THRMB (HEMOSTASIS) ×2
APL ESCP 34 STRL LF DISP (HEMOSTASIS)
APPLICATOR SURGIFLO ENDO (HEMOSTASIS) IMPLANT
BLADE CLIPPER SENSICLIP SURGIC (BLADE) ×3 IMPLANT
BLADE SURG 15 STRL LF DISP TIS (BLADE) ×2 IMPLANT
BLADE SURG 15 STRL SS (BLADE) ×3
DECANTER SPIKE VIAL GLASS SM (MISCELLANEOUS) ×3 IMPLANT
DERMABOND ADVANCED (GAUZE/BANDAGES/DRESSINGS) ×1
DERMABOND ADVANCED .7 DNX12 (GAUZE/BANDAGES/DRESSINGS) ×2 IMPLANT
DEVICE CAPIO SLIM SINGLE (INSTRUMENTS) IMPLANT
ELECT REM PT RETURN 9FT ADLT (ELECTROSURGICAL)
ELECTRODE REM PT RTRN 9FT ADLT (ELECTROSURGICAL) IMPLANT
GAUZE 4X4 16PLY ~~LOC~~+RFID DBL (SPONGE) ×3 IMPLANT
GLOVE SURG ENC MOIS LTX SZ6 (GLOVE) ×3 IMPLANT
GLOVE SURG UNDER POLY LF SZ6.5 (GLOVE) ×3 IMPLANT
GOWN STRL REUS W/TWL LRG LVL3 (GOWN DISPOSABLE) ×3 IMPLANT
HIBICLENS CHG 4% 4OZ BTL (MISCELLANEOUS) ×3 IMPLANT
HOLDER FOLEY CATH W/STRAP (MISCELLANEOUS) ×3 IMPLANT
KIT TURNOVER CYSTO (KITS) ×3 IMPLANT
MANIFOLD NEPTUNE II (INSTRUMENTS) ×3 IMPLANT
NEEDLE HYPO 22GX1.5 SAFETY (NEEDLE) ×3 IMPLANT
NS IRRIG 1000ML POUR BTL (IV SOLUTION) ×3 IMPLANT
NS IRRIG 500ML POUR BTL (IV SOLUTION) ×1 IMPLANT
PACK CYSTO (CUSTOM PROCEDURE TRAY) ×3 IMPLANT
PACK VAGINAL WOMENS (CUSTOM PROCEDURE TRAY) ×3 IMPLANT
RETRACTOR LONE STAR DISPOSABLE (INSTRUMENTS) ×3 IMPLANT
RETRACTOR STAY HOOK 5MM (MISCELLANEOUS) ×3 IMPLANT
SET IRRIG Y TYPE TUR BLADDER L (SET/KITS/TRAYS/PACK) ×3 IMPLANT
SUCTION FRAZIER HANDLE 10FR (MISCELLANEOUS) ×3
SUCTION TUBE FRAZIER 10FR DISP (MISCELLANEOUS) ×2 IMPLANT
SURGIFLO W/THROMBIN 8M KIT (HEMOSTASIS) ×1 IMPLANT
SUT ABS MONO DBL WITH NDL 48IN (SUTURE) IMPLANT
SUT MON AB 2-0 SH 27 (SUTURE) IMPLANT
SUT VIC AB 0 CT1 27 (SUTURE)
SUT VIC AB 0 CT1 27XBRD ANTBC (SUTURE) IMPLANT
SUT VIC AB 2-0 SH 27 (SUTURE) ×6
SUT VIC AB 2-0 SH 27XBRD (SUTURE) ×2 IMPLANT
SUT VICRYL 2-0 SH 8X27 (SUTURE) IMPLANT
SYR BULB EAR ULCER 3OZ GRN STR (SYRINGE) ×3 IMPLANT
SYS SLING ADV FIT BLUE TRNSVAG (Sling) ×1 IMPLANT
TOWEL OR 17X26 10 PK STRL BLUE (TOWEL DISPOSABLE) ×3 IMPLANT
TRAY FOLEY W/BAG SLVR 14FR (SET/KITS/TRAYS/PACK) ×3 IMPLANT

## 2021-07-18 NOTE — Addendum Note (Signed)
Addendum  created 07/18/21 1425 by Suan Halter, CRNA   Charge Capture section accepted, Clinical Note Signed, Intraprocedure Event edited

## 2021-07-18 NOTE — Anesthesia Preprocedure Evaluation (Addendum)
Anesthesia Evaluation  Patient identified by MRN, date of birth, ID band Patient awake    Reviewed: Allergy & Precautions, NPO status , Patient's Chart, lab work & pertinent test results  History of Anesthesia Complications (+) PONV and history of anesthetic complications  Airway Mallampati: III  TM Distance: >3 FB Neck ROM: Full    Dental no notable dental hx.    Pulmonary sleep apnea and Continuous Positive Airway Pressure Ventilation ,    Pulmonary exam normal breath sounds clear to auscultation       Cardiovascular hypertension, Normal cardiovascular exam Rhythm:Regular Rate:Normal     Neuro/Psych PSYCHIATRIC DISORDERS Anxiety Depression negative neurological ROS     GI/Hepatic negative GI ROS, Neg liver ROS,   Endo/Other  Morbid obesity  Renal/GU negative Renal ROS     Musculoskeletal negative musculoskeletal ROS (+)   Abdominal (+) + obese,   Peds  Hematology negative hematology ROS (+)   Anesthesia Other Findings stress urinary incontinence  Reproductive/Obstetrics                            Anesthesia Physical Anesthesia Plan  ASA: 4  Anesthesia Plan: General   Post-op Pain Management:    Induction: Intravenous  PONV Risk Score and Plan: 4 or greater and Ondansetron, Dexamethasone, Midazolam, Scopolamine patch - Pre-op, Propofol infusion and Treatment may vary due to age or medical condition  Airway Management Planned: Oral ETT  Additional Equipment:   Intra-op Plan:   Post-operative Plan: Extubation in OR  Informed Consent: I have reviewed the patients History and Physical, chart, labs and discussed the procedure including the risks, benefits and alternatives for the proposed anesthesia with the patient or authorized representative who has indicated his/her understanding and acceptance.     Dental advisory given  Plan Discussed with: CRNA  Anesthesia Plan  Comments:        Anesthesia Quick Evaluation

## 2021-07-18 NOTE — Op Note (Signed)
Operative Note  Preoperative Diagnosis: stress urinary incontinence  Postoperative Diagnosis: same  Procedures performed:  Midurethral sling (Advantage Fit), cystoscopy  Implants:  Implant Name Type Inv. Item Serial No. Manufacturer Lot No. LRB No. Used Action  SYS SLING ADV FIT BLUE TRNSVAG - [id number] Sling SYS SLING ADV FIT [place] [id number] N/A 1 Implanted    Attending Surgeon: Sherlene Shams, MD  Anesthesia: General endotracheal  Findings: On cystoscopy, normal bladder and urethra without injury, lesion or foreign body. Brisk bilateral ureteral efflux noted.    Estimated blood loss: 100 mL  IV fluids: 500 mL  Urine output: see flowsheet  Complications: none  Procedure in Detail: After informed consent was obtained, the patient was taken to the operating room where she was placed under anesthesia.  She was then placed in the dorsal lithotomy position with allen stirrups,  and prepped and draped in the usual sterile fashion.  A strap was placed across her upper abdomen on top of her gown so it was not in direct contact with her skin.  Care was taken to avoid hyperflexion or hyperextension of her upper and lower extremities. A foley catheter was placed.  A lonestar self-retraining retractor was placed with 4 stay hooks. The mid urethral area was located on the anterior vaginal wall.  Two Allis clamps were placed at the level of the midurethra. 1% lidocaine with epinephrine was injected into the vaginal mucosa. A vertical incision was made between the two clamps using a 15-blade scalpel.  Using sharp dissection, Metzenbaum scissors were used to make a periurethral tunnel from the vaginal incision towards the pubic rami bilaterally for the future sling tracts. The bladder was ensured to be empty. The trocar and attached sling were introduced into the right side of the periurethral vaginal incision, just inferior to the pubic symphysis on the right side. The trocar  was guided through the endopelvic fascia and directly vertically.  While hugging the cephalad surface of the pubic bone, the trocar was guided out through the abdomen 2 fingerbreadths lateral to midline at the level of the pubic symphysis on the ipsilateral side. The trocar was placed on the left side in a similar fashion.  A 70-degree cystoscope was introduced, and 360-degree inspection revealed no trauma or trocars in the bladder, with brisk bilateral ureteral efflux.  The bladder was drained and the cystoscope was removed.  The Foley catheter was reinserted.  The sling was brought to lie beneath the mid-urethra.  A needle driver was placed behind the sling to ensure no tension.   The plastic sheath was removed from the sling and the distal ends of the sling were trimmed just below the level of the skin incisions.  Tension-free positioning of the sling was confirmed. Vaginal inspection revealed no vaginotomy or sling perforations of the mucosa. Surgiflo was placed into the incision. The vaginal mucosal edges were reapproximated using 2-0 Vicryl.  The vagina was copiously irrigated.  Bleeding was noted from the vagina at the location of the left upper stay hook. This was repaired with a 2-0 vicryl. Hemostasis was again noted. Vaginal packing not placed. The suprapubic sling incisions were closed with Dermabond. The patient tolerated the procedure well.  She was awakened from anesthesia and transferred to the recovery room in stable condition. All needle and sponge counts were correct x 2.     Amanda Folds, MD

## 2021-07-18 NOTE — Transfer of Care (Addendum)
Immediate Anesthesia Transfer of Care Note  Patient: Amanda Baker  Procedure(s) Performed: Procedure(s) (LRB): TRANSVAGINAL TAPE (TVT) PROCEDURE (N/A) CYSTOSCOPY (N/A)  Patient Location: PACU  Anesthesia Type: General  Level of Consciousness: awake, oriented, sedated and patient cooperative  Airway & Oxygen Therapy: Patient Spontanous Breathing and Patient connected to non rebreather face mask oxygen  Post-op Assessment: Report given to PACU RN and Post -op Vital signs reviewed and stable  Post vital signs: Reviewed and stable  Complications: No apparent anesthesia complications  Last Vitals:  Vitals Value Taken Time  BP 143/76 07/18/21 1304  Temp    Pulse 88 07/18/21 1308  Resp 17 07/18/21 1308  SpO2 100 % 07/18/21 1308  Vitals shown include unvalidated device data.  Last Pain:  Vitals:   07/18/21 1027  TempSrc: Oral  PainSc: 0-No pain      Patients Stated Pain Goal: 5 (03/52/48 1859)  Complications: No notable events documented.

## 2021-07-18 NOTE — Anesthesia Procedure Notes (Signed)
Procedure Name: Intubation Date/Time: 07/18/2021 11:40 AM Performed by: Suan Halter, CRNA Pre-anesthesia Checklist: Patient identified, Emergency Drugs available, Suction available and Patient being monitored Patient Re-evaluated:Patient Re-evaluated prior to induction Oxygen Delivery Method: Circle system utilized Preoxygenation: Pre-oxygenation with 100% oxygen Induction Type: IV induction Ventilation: Mask ventilation without difficulty Laryngoscope Size: 3, Glidescope and Mac Grade View: Grade I Tube type: Oral Tube size: 7.0 mm Number of attempts: 1 Airway Equipment and Method: Stylet and Oral airway Placement Confirmation: ETT inserted through vocal cords under direct vision, positive ETCO2 and breath sounds checked- equal and bilateral Secured at: 22 cm Tube secured with: Tape Dental Injury: Teeth and Oropharynx as per pre-operative assessment  Comments: Pt with overbite, poor dentition, small mouth and large thick neck. Proceeded directly with Glidescope and intubated with clear view of cords

## 2021-07-18 NOTE — Discharge Instructions (Addendum)
Post Anesthesia Home Care Instructions  Activity: Get plenty of rest for the remainder of the day. A responsible individual must stay with you for 24 hours following the procedure.  For the next 24 hours, DO NOT: -Drive a car -Paediatric nurse -Drink alcoholic beverages -Take any medication unless instructed by your physician -Make any legal decisions or sign important papers.  Meals: Start with liquid foods such as gelatin or soup. Progress to regular foods as tolerated. Avoid greasy, spicy, heavy foods. If nausea and/or vomiting occur, drink only clear liquids until the nausea and/or vomiting subsides. Call your physician if vomiting continues.  Special Instructions/Symptoms: Your throat may feel dry or sore from the anesthesia or the breathing tube placed in your throat during surgery. If this causes discomfort, gargle with warm salt water. The discomfort should disappear within 24 hours.  If you had a scopolamine patch placed behind your ear for the management of post- operative nausea and/or vomiting:  1. The medication in the patch is effective for 72 hours, after which it should be removed.  Wrap patch in a tissue and discard in the trash. Wash hands thoroughly with soap and water. 2. You may remove the patch earlier than 72 hours if you experience unpleasant side effects which may include dry mouth, dizziness or visual disturbances. 3. Avoid touching the patch. Wash your hands with soap and water after contact with the patch.     Nystatin powder was prescribed for skin rash under abdominal folds/ groin area. Place powder three times per day until rash has cleared.   POST OPERATIVE INSTRUCTIONS  General Instructions Recovery (not bed rest) will last approximately 6 weeks Walking is encouraged, but refrain from strenuous exercise/ housework/ heavy lifting. No lifting >10lbs  Nothing in the vagina- NO intercourse, tampons or douching Bathing:  Do not submerge in water (NO  swimming, bath, hot tub, etc) until after your postop visit. You can shower starting the day after surgery.  No driving until you are not taking narcotic pain medicine and until your pain is well enough controlled that you can slam on the breaks or make sudden movements if needed.   Taking your medications Please take your acetaminophen and ibuprofen on a schedule for the first 48 hours. Take 600mg  ibuprofen, then take 500mg  acetaminophen 3 hours later, then continue to alternate ibuprofen and acetaminophen. That way you are taking each type of medication every 6 hours. Take the prescribed narcotic (oxycodone, tramadol, etc) as needed, with a maximum being every 4 hours.  Take a stool softener daily to keep your stools soft and preventing you from straining. If you have diarrhea, you decrease your stool softener. This is explained more below. We have prescribed you Miralax.  Reasons to Call the Nurse (see last page for phone numbers) Heavy Bleeding (changing your pad every 1-2 hours) Persistent nausea/vomiting Fever (100.4 degrees or more) Incision problems (pus or other fluid coming out, redness, warmth, increased pain)  Things to Expect After Surgery Mild to Moderate pain is normal during the first day or two after surgery. If prescribed, take Ibuprofen or Tylenol first and use the stronger medicine for break-through pain. You can overlap these medicines because they work differently.   Constipation   To Prevent Constipation:  Eat a well-balanced diet including protein, grains, fresh fruit and vegetables.  Drink plenty of fluids. Walk regularly.  Depending on specific instructions from your physician: take Miralax daily and additionally you can add a stool softener (colace/ docusate) and fiber  supplement. Continue as long as you're on pain medications.   To Treat Constipation:  If you do not have a bowel movement in 2 days after surgery, you can take 2 Tbs of Milk of Magnesia 1-2 times a day  until you have a bowel movement. If diarrhea occurs, decrease the amount or stop the laxative. If no results with Milk of Magnesia, you can drink a bottle of magnesium citrate which you can purchase over the counter.  Fatigue:  This is a normal response to surgery and will improve with time.  Plan frequent rest periods throughout the day.  Gas Pain:  This is very common but can also be very painful! Drink warm liquids such as herbal teas, bouillon or soup. Walking will help you pass more gas.  Mylicon or Gas-X can be taken over the counter.  Leaking Urine:  Varying amounts of leakage may occur after surgery.  This should improve with time. Your bladder needs at least 3 months to recover from surgery. If you leak after surgery, be sure to mention this to your doctor at your post-op visit. If you were taking medications for overactive bladder prior to surgery, be sure to restart the medications immediately after surgery.  Incisions: If you have incisions on your abdomen, the skin glue will dissolve on its own over time. It is ok to gently rinse with soap and water over these incisions but do not scrub.  Catheter Approximately 50% of patients are unable to urinate after surgery and need to go home with a catheter. This allows your bladder to rest so it can return to full function. If you go home with a catheter, the office will call to set up a voiding trial a few days after surgery. For most patients, by this visit, they are able to urinate on their own. Long term catheter use is rare.   Return to Work  As work demands and recovery times vary widely, it is hard to predict when you will want to return to work. If you have a desk job with no strenuous physical activity, and if you would like to return sooner than generally recommended, discuss this with your provider or call our office.   Post op concerns  For non-emergent issues, please call the Urogynecology Nurse. Please leave a message and someone  will contact you within one business day.  You can also send a message through [place].   AFTER HOURS (After 5:00 PM and on weekends):  For urgent matters that cannot wait until the next business day. Call our office [phone] and connect to the doctor on call.  Please reserve this for important issues.   **FOR ANY TRUE EMERGENCY ISSUES CALL 911 OR GO TO THE NEAREST EMERGENCY ROOM.** Please inform our office or the doctor on call of any emergency.     APPOINTMENTS: Call [phone]

## 2021-07-18 NOTE — Telephone Encounter (Signed)
Amanda Baker underwent midurethral sling and cystoscopy on 07/18/21.   She failed her voiding trial.  362ml was backfilled into the bladder She was unable to void  She was discharged with a catheter. Please call her for a routine post op check and to schedule a voiding trial by Monday Feb 6th. Thanks!  Jaquita Folds, MD

## 2021-07-18 NOTE — Anesthesia Postprocedure Evaluation (Signed)
Anesthesia Post Note  Patient: Amanda Baker  Procedure(s) Performed: TRANSVAGINAL TAPE (TVT) PROCEDURE (Vagina ) CYSTOSCOPY (Bladder)     Patient location during evaluation: PACU Anesthesia Type: General Level of consciousness: awake Pain management: pain level controlled Vital Signs Assessment: post-procedure vital signs reviewed and stable Respiratory status: spontaneous breathing, nonlabored ventilation, respiratory function stable and patient connected to nasal cannula oxygen Cardiovascular status: blood pressure returned to baseline and stable Postop Assessment: no apparent nausea or vomiting Anesthetic complications: no   No notable events documented.  Last Vitals:  Vitals:   07/18/21 1330 07/18/21 1345  BP: 134/70 117/73  Pulse: 81 86  Resp: 16 17  Temp:    SpO2: 98% 98%    Last Pain:  Vitals:   07/18/21 1345  TempSrc:   PainSc: 7                  Irwin Toran P Rondy Krupinski

## 2021-07-18 NOTE — H&P (Signed)
[facility] Pre-op H&P  SUBJECTIVE  History of Present Illness: Amanda Baker is a 51 y.o. female with mixed incontinence. She has started on the solifenacin 5mg  and this has helped with her waking to urinate at night. Continues to have leakage with cough and sneeze.    Past Medical History: Patient  has a past medical history of Anxiety state, unspecified, Bladder disorder, HISTORY OF Acute thyroiditis (06/16/2002), History of COVID-19 (02/2021), History of Essential hypertension, history of Hypercholesteremia, History of kidney stones, Insomnia, unspecified, Major depression, melanoma Cancer ([place]), PONV (postoperative nausea and vomiting), Sleep apnea, Umbilical hernia, and Wears glasses OR CONTACTS.   Past Surgical History: She  has a past surgical history that includes Cholecystectomy (06/16/1993); Umbilical hernia repair; Eye surgery (Left, 06/16/1977); Tonsillectomy (06/17/1987); WISDOM TEETH EXTRACT 1996; VAGINAL WALL NODULES SURGICALLY REMOVED 1998; Wound debridement (12/11/2011); Umbilical hernia repair (07/18/2011); Tympanostomy tube placement (Right, 06/16/2012); chin flap [facility] rearrangement 01-06-2019 [facility]; m[facility]s procedure jan 2020 at [facility] surgical center chin jan 2020; and chin reconstructions jan 2020 at [facility].   Medications: She has a current medication list which includes the following prescription(s): buspirone, estradiol, latuda, progesterone, solifenacin, trazodone, and vortioxetine hbr.   Allergies: Patient is allergic to penicillins, erythromycin, codeine, and lisinopril.   Social History: Patient  reports that she has never smoked. She has never used smokeless tobacco. She reports that she does not drink alcohol and does not use drugs.      OBJECTIVE     Physical Exam: Vitals:   07/15/21 1259 07/18/21 1027  BP:  (!) 154/82  Pulse:  92  Resp:  18  Temp:  98 F (36.7 C)  TempSrc:  Oral  SpO2:  98%  Weight: 121.6 kg 124.2 kg   Height: 5\' 2"  (1.575 m) 5\' 2"  (1.575 m)   Gen: No apparent distress, A&O x 3. Lungs: normal respirations Abd: soft, nontender   Detailed Urogynecologic Evaluation:  Deferred. Prior exam showed:  POP-Q (04/03/21):    POP-Q   -3                                            Aa   -3                                           Ba   -6                                              C    3                                            Gh   4                                            Pb   9  tvl    -3                                            Ap   -3                                            Bp   -8.5                                              D             CMG Interpretation: CMG showed normal sensation, and normal cystometric capacity. Findings positive for stress incontinence, negative for detrusor overactivity.     ASSESSMENT AND PLAN    Amanda Baker is a 51 y.o. with:  1. SUI (stress urinary incontinence, female)     Plan for surgery: Exam under anesthesia, midurethral sling, cystoscopy  Jaquita Folds, MD

## 2021-07-18 NOTE — OR Nursing (Signed)
16:03 Pt has no urge to void.  Attempts to void without results.  Bladder scanned for 471cc.  Called to Dr. Wannetta Sender.  Orders received to send patient home with a foley  cathetor and instruct on how to take care of cathetor.  Approx 16:30 16 F foley cathetor inserted into bladder , expelled 550cc clear orange urine .  Pt and pt husband instructed on how to care for cathetor at home. Darin Engels rn

## 2021-07-19 ENCOUNTER — Encounter (HOSPITAL_BASED_OUTPATIENT_CLINIC_OR_DEPARTMENT_OTHER): Payer: Self-pay | Admitting: Obstetrics and Gynecology

## 2021-07-19 NOTE — Telephone Encounter (Signed)
Post- Op Call  Truett Perna underwent midurethral sling and cystoscopy on 07/18/21 with Dr Wannetta Sender. The patient reports that her pain is controlled. She is taking ibuprofen and tylenol rotating. She reports vaginal bleeding described as small. She has not had a bowel movement and is taking miralax for a bowel regimen. She was discharged with a catheter and will return on 07/22/21 @0930  for a voiding trial.   Blenda Nicely, RN

## 2021-07-22 ENCOUNTER — Encounter: Payer: Self-pay | Admitting: *Deleted

## 2021-07-22 ENCOUNTER — Other Ambulatory Visit: Payer: Self-pay

## 2021-07-22 ENCOUNTER — Ambulatory Visit (INDEPENDENT_AMBULATORY_CARE_PROVIDER_SITE_OTHER): Payer: 59

## 2021-07-22 VITALS — BP 152/87 | HR 97 | Wt 273.0 lb

## 2021-07-22 DIAGNOSIS — N952 Postmenopausal atrophic vaginitis: Secondary | ICD-10-CM

## 2021-07-22 NOTE — Patient Instructions (Signed)
Please keep all follow up appointments

## 2021-07-22 NOTE — Progress Notes (Signed)
Urogyn Nurse Voiding Trial Note  Amanda Baker underwent midurethral sling and cystoscopy on 07/18/2021.  She presents today for a voiding trial .   Patient was identified with 2 indicators. 350ml of NS was instilled into the bladder via the catheter. The catheter was removed and patient was instructed to void into the urinary hat. She voided 285ml. The post void residual measured by bladder scan was 150ml. She passed the voiding trial and a catheter was not replaced.   The patient received aftercare instructions and will follow up as scheduled.    Pt also complains of vaginal bleeding.

## 2021-08-29 NOTE — Progress Notes (Signed)
[facility] ? ?Date of Visit: 08/30/2021 ? ?History of Present Illness: Ms. Jeangilles is a 51 y.o. female scheduled today for a post-operative visit.  ?? Surgery: s/p midurethral sling and cystoscopy on 07/18/21. Passed repeat voiding trial on 07/22/21.  ?? She did not pass her postoperative void trial.  ? ?Today she reports some burning at the end of urination.  ?She does not leak urine with cough or sneeze.  ?She has been taking the vesicare daily. Urgency has been better overall. The last few nights she has woken up to urinate.  ?Still notices that the pad is wet/ moist.  But is not noticing any specific symptoms with leakage.  ?She is wondering when she can restart her estrogen cream.  ? ?UTI in the last 6 weeks? No - but currently having some burning with urination.  ?Pain? No  ?Voiding dysfunction: No  ?Bowel issues: No - wants to go back on the miralax since it was helping with her contstipation.  ? ? ?Medications: She has a current medication list which includes the following prescription(s): acetaminophen, buspirone, estradiol, estradiol, latuda, latuda, nystatin, progesterone, solifenacin, trazodone, and vortioxetine hbr.  ? ?Allergies: Patient is allergic to penicillins, erythromycin, codeine, and lisinopril.  ? ?Physical Exam: ?BP (!) 142/86   Pulse 81   ? ?Suprapubic Incisions: healing well.  ?Pelvic Examination: Vagina: Incisions healing well. Sutures are not present at incision line and there is not granulation tissue. No tenderness.  No visible or palpable mesh. ? ?POP-Q: ?deferred ?--------------------------------------------------------- ? ?Assessment and Plan:  ?1. Leukocytes in urine   ?2. Urinary frequency   ?3. Overactive bladder   ? ? ?- SUI has improved overall. Unclear if leakage she is having is due to movement or OAB. Will increase dose of Vesicare to '10mg'$  daily.  ?- Urine culture sent today due to small leukocytes on POC urine ?- Restart vaginal estrogen twice weekly.  ?- Continue  with miralax daily to prevent constipation ?- Can resume regular activity including exercise and intercourse,  if desired. Ok to return to work.  ? ?Return 2 months for follow up ? ?Jaquita Folds, MD ? ? ?

## 2021-08-30 ENCOUNTER — Other Ambulatory Visit (HOSPITAL_COMMUNITY)
Admission: RE | Admit: 2021-08-30 | Discharge: 2021-08-30 | Disposition: A | Discharge status: Home / Self Care | Payer: 59 | Source: Ambulatory Visit | Attending: Obstetrics and Gynecology | Admitting: Obstetrics and Gynecology

## 2021-08-30 ENCOUNTER — Ambulatory Visit (INDEPENDENT_AMBULATORY_CARE_PROVIDER_SITE_OTHER): Payer: 59 | Admitting: Obstetrics and Gynecology

## 2021-08-30 ENCOUNTER — Encounter: Payer: Self-pay | Admitting: Obstetrics and Gynecology

## 2021-08-30 ENCOUNTER — Other Ambulatory Visit: Payer: Self-pay

## 2021-08-30 VITALS — BP 142/86 | HR 81

## 2021-08-30 DIAGNOSIS — R82998 Other abnormal findings in urine: Secondary | ICD-10-CM | POA: Diagnosis not present

## 2021-08-30 DIAGNOSIS — R350 Frequency of micturition: Secondary | ICD-10-CM

## 2021-08-30 DIAGNOSIS — N3281 Overactive bladder: Secondary | ICD-10-CM

## 2021-08-30 LAB — POCT URINALYSIS DIPSTICK
Appearance: ABNORMAL
Bilirubin, UA: NEGATIVE
Glucose, UA: NEGATIVE
Ketones, UA: NEGATIVE
Nitrite, UA: NEGATIVE
Protein, UA: NEGATIVE
Spec Grav, UA: >=1.03 — AB (ref 1.010–1.025)
Urobilinogen, UA: 0.2 E.U./dL
pH, UA: 6 (ref 5.0–8.0)

## 2021-08-30 MED ORDER — SOLIFENACIN SUCCINATE 10 MG PO TABS: 10.0000 mg | ORAL_TABLET | Freq: Every day | ORAL | 5 refills | Status: DC

## 2021-08-30 NOTE — Patient Instructions (Signed)
Take Miralax over the counter for constipation. Can take every day if needed.  ?

## 2021-08-31 LAB — URINE CULTURE

## 2021-09-18 ENCOUNTER — Telehealth: Payer: Self-pay | Admitting: Obstetrics and Gynecology

## 2021-09-18 DIAGNOSIS — N3281 Overactive bladder: Secondary | ICD-10-CM

## 2021-09-18 MED ORDER — SOLIFENACIN SUCCINATE 10 MG PO TABS: 10.0000 mg | ORAL_TABLET | Freq: Every day | ORAL | 5 refills | Status: AC

## 2021-09-18 NOTE — Telephone Encounter (Signed)
Rx for vesicare was changed from [place] to [place]. ?

## 2021-09-18 NOTE — Telephone Encounter (Signed)
Pt was notified.  

## 2021-10-31 ENCOUNTER — Ambulatory Visit (INDEPENDENT_AMBULATORY_CARE_PROVIDER_SITE_OTHER): Payer: 59 | Admitting: Obstetrics and Gynecology

## 2021-10-31 ENCOUNTER — Encounter: Payer: Self-pay | Admitting: Obstetrics and Gynecology

## 2021-10-31 VITALS — BP 119/66 | HR 82

## 2021-10-31 DIAGNOSIS — N3281 Overactive bladder: Secondary | ICD-10-CM | POA: Diagnosis not present

## 2021-10-31 DIAGNOSIS — Z01818 Encounter for other preprocedural examination: Secondary | ICD-10-CM

## 2021-10-31 MED ORDER — DIAZEPAM 5 MG PO TABS
5.0000 mg | ORAL_TABLET | Freq: Two times a day (BID) | ORAL | 0 refills | Status: DC | PRN
Start: 2021-10-31 — End: 2022-03-19

## 2021-10-31 MED ORDER — SULFAMETHOXAZOLE-TRIMETHOPRIM 800-160 MG PO TABS: 1.0000 | ORAL_TABLET | Freq: Once | ORAL | 0 refills | Status: DC | PRN

## 2021-10-31 NOTE — Progress Notes (Signed)
[facility]  Date of Visit: 10/31/2021  History of Present Illness: Amanda Baker is a 51 y.o. female here for follow up for overactive bladder.  s/p midurethral sling and cystoscopy on 07/18/21.   Dose of vesicare was increased to '10mg'$  daily. She noticed slight improvement, but still having leakage on her pad daily. If she drinks more tea and soft drinks, can make leakage worse.   Medications: She has a current medication list which includes the following prescription(s): acetaminophen, buspirone, estradiol, estradiol, latuda, latuda, nystatin, progesterone, solifenacin, trazodone, and vortioxetine hbr.   Allergies: Patient is allergic to penicillins, erythromycin, codeine, and lisinopril.   Physical Exam: BP 119/66   Pulse 82    Gen: No distress  POP-Q: deferred ---------------------------------------------------------  Assessment and Plan:  No diagnosis found.  - For refractory OAB we reviewed the procedure for intravesical Botox injection with cystoscopy in the office and reviewed the risks, benefits and alternatives of treatment including but not limited to infection, need for self-catheterization and need for repeat therapy.  We discussed that there is a 5-15% chance of needing to catheterize with Botox and that this usually resolves in a few months; however can persist for longer periods of time.  Typically Botox injections would need to be repeated every 3-12 months since this is not a permanent therapy.  -We discussed the role of sacral neuromodulation and how it works. It requires a test phase, and documentation of bladder function via diary. After a successful test period, a permanent wire and generator are placed in the OR. The battery lasts 5 years on average and would need to be replaced surgically.  The goal of this therapy is at least a 50% improvement in symptoms.  We also discussed the role of percutaneous tibial nerve stimulation and how it works.  She  understands it requires 12 weekly visits for temporary neuromodulation of the sacral nerve roots via the tibial nerve and that she may then require continued tapered treatment.   - She would like to proceed with intravesical botox. She will return for the procedure and for self- cath teaching - Prescribed Bactrim DS x1 tab to take prior to the procedure. Also prescribed valium '5mg'$  to take prior to the procedure. She understands she will need a driver on the day of the procedure if taking valium.  Amanda Folds, MD

## 2021-11-01 ENCOUNTER — Encounter: Payer: Self-pay | Admitting: Obstetrics and Gynecology

## 2021-11-14 ENCOUNTER — Encounter: Payer: Self-pay | Admitting: Obstetrics and Gynecology

## 2021-11-14 ENCOUNTER — Ambulatory Visit: Payer: 59 | Admitting: Obstetrics and Gynecology

## 2021-11-14 ENCOUNTER — Ambulatory Visit (INDEPENDENT_AMBULATORY_CARE_PROVIDER_SITE_OTHER): Payer: 59 | Admitting: *Deleted

## 2021-11-14 VITALS — BP 123/75 | HR 85

## 2021-11-14 DIAGNOSIS — N3281 Overactive bladder: Secondary | ICD-10-CM

## 2021-11-14 LAB — POCT URINALYSIS DIP (CLINITEK)
Bilirubin, UA: NEGATIVE
Glucose, UA: NEGATIVE mg/dL
Ketones, POC UA: NEGATIVE mg/dL
Nitrite, UA: NEGATIVE
POC PROTEIN,UA: NEGATIVE
Spec Grav, UA: 1.015 (ref 1.010–1.025)
Urobilinogen, UA: 0.2 E.U./dL
pH, UA: 6.5 (ref 5.0–8.0)

## 2021-11-14 NOTE — Progress Notes (Signed)
Intravesical Botox Procedure:  Vitals:   11/14/21 1624  BP: 123/75  Pulse: 85  SpO2: 95%    Informed consent was obtained. Pt underwent self-catheterization teaching prior to undergoing the procedure.   The patient took bactrim for antibiotic prophylaxis and '5mg'$  valium prior to the procedure. Time out was performed. The bladder was catherized and 30 ml of 1% lidocaine (NDC [phone]) was placed in the bladder and 10 ml of 2% lidocaine jelly placed in the urethra.  Cystoscopy was performed with sterile H2O and a 0 degree scope. Bladder mucosa was noted to be within normal limits. A total of 10 ml (100 units) of Botox A,  Lot # [id number] 08/2023  was injected in the detrusor muscle via 5 injections of 85m each. These were spaced about 1 cm apart. Care was taken to avoid the ureteral orifices and the trigone. Patient tolerated the procedure well.  Impression: 51y.o. 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 Patient tolerated procedure well.  Plan: Post-procedure instructions given regarding bleeding, infection, urinary retention.    Patient will follow up in 3 weeks  MJaquita Folds MD

## 2021-11-14 NOTE — Progress Notes (Signed)
The patient presented for self-cath education per Dr Tommas Olp orders.  The patient is having a Cystoscopy with Botox today with a slight risk of urinary retention about 2-3 weeks after today's procedure.  I reviewed the self cath handout with the patient as well as reviewing the samples of caths in the supply bag.  Hand Hygiene performed. I used a mirror to show the patient her anatomy for education purposes.  We discussed looking at anatomy as well as self-cathing herself prior to the need to ensure she can complete the process.  I brought Dr Wannetta Sender in the room due to needing more hands.  We used the longer self cath to help the patient use it.  We advised to place one finger in the vagina and then place the cath above it.  The patient does understand and feels comfortable with the self cath process.   Urine sample collected from cath.   Sharrie Rothman CMA/Dr Wannetta Sender

## 2021-11-14 NOTE — Patient Instructions (Signed)
Taking Care of Yourself after Urodynamics, Cystoscopy, Coaptite Injection, or Botox Injection   Drink plenty of water for a day or two following your procedure. Try to have about 8 ounces (one cup) at a time, and do this 6 times or more per day unless you have fluid restrictitons AVOID irritative beverages such as coffee, tea, soda, alcoholic or citrus drinks for a day or two, as this may cause burning with urination.  For the first 1-2 days after the procedure, your urine may be pink or red in color. You may have some blood in your urine as a normal side effect of the procedure. Large amounts of bleeding or difficulty urinating are NOT normal. Call the nurse line if this happens or go to the nearest Emergency Room if the bleeding is heavy or you cannot urinate at all and it is after hours.  You may experience some discomfort or a burning sensation with urination after having this procedure. You can use over the counter Azo or pyridium to help with burning and follow the instructions on the packaging. If it does not improve within 1-2 days, or other symptoms appear (fever, chills, or difficulty urinating) call the office to speak to a nurse.  You may return to normal daily activities such as work, school, driving, exercising and housework on the day of the procedure. If your doctor gave you a prescription, take it as ordered.

## 2021-12-05 ENCOUNTER — Encounter: Payer: Self-pay | Admitting: Obstetrics and Gynecology

## 2021-12-05 ENCOUNTER — Ambulatory Visit (INDEPENDENT_AMBULATORY_CARE_PROVIDER_SITE_OTHER): Payer: 59 | Admitting: Obstetrics and Gynecology

## 2021-12-05 VITALS — BP 141/88 | HR 80

## 2021-12-05 DIAGNOSIS — N3281 Overactive bladder: Secondary | ICD-10-CM | POA: Diagnosis not present

## 2021-12-05 NOTE — Patient Instructions (Signed)
Vulvovaginal moisturizer Options: Vitamin E oil (pump or capsule) or cream (Gene's Vit E Cream) Coconut oil Silicone-based lubricant for use during intercourse ("wet platinum" is a brand available at most drugstores) Crisco Consider the ingredients of the product - the fewer the ingredients the better!  Directions for Use: Clean and dry your hands Gently dab the vulvar/vaginal area dry as needed Apply a "pea-sized" amount of the moisturizer onto your fingertip Using you other hand, open the labia  Apply the moisturizer to the vulvar/vaginal tissues Wear loose fitting underwear/clothing if possible following application Use moisturize up to 3 times daily as desired.  

## 2022-02-21 ENCOUNTER — Other Ambulatory Visit: Payer: Self-pay | Admitting: Obstetrics and Gynecology

## 2022-02-21 DIAGNOSIS — N950 Postmenopausal bleeding: Secondary | ICD-10-CM

## 2022-02-24 ENCOUNTER — Ambulatory Visit
Admission: RE | Admit: 2022-02-24 | Discharge: 2022-02-24 | Disposition: A | Discharge status: Home / Self Care | Payer: 59 | Source: Ambulatory Visit | Attending: Obstetrics and Gynecology | Admitting: Obstetrics and Gynecology

## 2022-02-24 DIAGNOSIS — N950 Postmenopausal bleeding: Secondary | ICD-10-CM

## 2022-03-19 ENCOUNTER — Other Ambulatory Visit: Payer: Self-pay

## 2022-03-19 ENCOUNTER — Encounter (HOSPITAL_BASED_OUTPATIENT_CLINIC_OR_DEPARTMENT_OTHER): Payer: Self-pay | Admitting: Obstetrics and Gynecology

## 2022-03-19 NOTE — Progress Notes (Addendum)
Spoke w/ via phone for pre-op interview---pt Lab needs dos----  cbc  , type and screen  , urine preg          Lab results------ COVID test -----positive covid test 03-05-2022 dr white eagle at [facility] results on chart, pt without symptoms, ok to proceed with 03-31-2022 surgery per dr ellander mda  Arrive at -------1000 am NPO after MN NO Solid Food.  Clear liquids from MN until---900 am Med rec completed Medications to take morning of surgery -----buspirone, trintellix, estradiol patch Diabetic medication -----n/a Patient instructed no nail polish to be worn day of surgery Patient instructed to bring photo id and insurance card day of surgery Patient aware to have Driver (ride ) / caregiver  madalena kesecker spouse   for 24 hours after surgery  Patient Special Instructions -----none Pre-Op special Istructions -----none Patient verbalized understanding of instructions that were given at this phone interview. Patient denies shortness of breath, chest pain, fever, cough at this phone interview.

## 2022-03-30 NOTE — H&P (Signed)
Amanda Baker is an 51 y.o. female P1 with recurrent postmenopausal bleeding.   Had been amenorrheic > 1 year, then had period in March, July, August 2023 Started HRT (E2 patch 0.'05mg'$  weekly and micronized progesterone '200mg'$ ) in October 2022.   EMB benign proliferative endometrium [place] 9.2cm anteverted uterus with 1m endometrial stripe   Menstrual History: Patient's last menstrual period was 03/06/2022.    Past Medical History:  Diagnosis Date   Anxiety state, unspecified    HISTORY OF Acute thyroiditis 06/16/2002   PROBLEM WITH THYROIDITIS RESOLVED   History of COVID-19 02/2021   FATIGUE/SINUS ISSUES X 7 DAYS ALL ISSUES RESOLVED TOOOK ORAL PO TREATMENT   History of COVID-19 03/05/2022   fatigue and sinus drainage all symptoms resolved, did home test, and dr office pcr test   History of Essential hypertension    no medications in 2 to 3 years per pt as of 03-19-2022   history of Hypercholesteremia    History of kidney stones    X 1- 25 YRS AGO PASSED ON OWN PER PT ON 1Feb 17, 2023  Insomnia, unspecified    Major depression    Pt continues to struggle - currently more anxiety than depression. Increasing Fluoxetine may aggrevate the RLS. She will increase 1 mg at a time.   [place]    SKIN CANCERS melanoma removed x 3  2020   PONV (postoperative nausea and vomiting)    SEVERE!!   PT REQUESTS SCOPOLAMINE PATCH FOR ALL SURGERIES!   Sleep apnea    uses cpap pt does not know settings   Umbilical hernia    Had repair surgery but was complicated with infection so pt. had a second repair surgery   Wears glasses OR CONTACTS     Past Surgical History:  Procedure Laterality Date   BLADDER SUSPENSION N/A 07/18/2021   Procedure: TRANSVAGINAL TAPE (TVT) PROCEDURE;  Surgeon: SJaquita Folds MD;  [facility]  Service: Gynecology;  Laterality: N/A;   [facility] 01-06-2019 [facility]     [place]   CYSTOSCOPY N/A 07/18/2021   Procedure: CYSTOSCOPY;  Surgeon: SJaquita Folds MD;  [facility]  Service: Gynecology;  Laterality: N/A;   EYE SURGERY Left 06/16/1977   STRABISMUS REPAIR (for lazy eye) at age 51  [facility]     TONSILLECTOMY  06/17/1987   TYMPANOSTOMY TUBE PLACEMENT Right [id number]/7564  UMBILICAL HERNIA REPAIR     Complicated with infection 33/3295  UMBILICAL HERNIA REPAIR  07/18/2011   2nd surgery on umbilicus   VAGINAL WALL NODULES SURGICALLY REMOVED 1998     [facility] [place]   Procedure: DEBRIDEMENT ABDOMINAL WOUND;  Surgeon: TEarnstine Regal MD;  Location: [facility];  Service: General;  Laterality: N/A;  Explore and debride abdominal wound    Family History  Problem Relation Age of Onset   Heart failure Mother    Heart disease Mother    CAD Mother 552  Osteoarthritis Mother    High Cholesterol Mother    Cancer Father        Non-small cell lung cancer   CAD Maternal Grandmother    Hypertension Maternal Grandmother    CAD Maternal Grandfather     Social History:  reports that she has never smoked. She  has never used smokeless tobacco. She reports that she does not drink alcohol and does not use drugs.  Allergies:  Allergies  Allergen Reactions   Penicillins Anaphylaxis and Other (See Comments)    Throat swelling, ears red   Erythromycin Hives    Severe abdominal cramping   Codeine Hives and Other (See Comments)    Hallucinations, stomach cramps   Lisinopril     Dry cough    No medications prior to admission.    Review of Systems  Constitutional:  Negative for fever.  HENT:  Negative for sore throat.   Eyes:  Negative for pain.  Respiratory:  Negative for shortness of breath.   Cardiovascular:  Negative for chest pain.  Gastrointestinal:  Negative for abdominal pain.  Genitourinary:   Positive for vaginal bleeding.  Musculoskeletal:  Negative for neck pain.  Skin:  Negative for rash.  Neurological:  Negative for headaches.  Psychiatric/Behavioral:  Negative for suicidal ideas.     Height '5\' 2"'$  (1.575 m), weight 117.9 kg, last menstrual period 03/06/2022. Physical Exam  Chaperone Chaperone: present  Constitutional *General Appearance: obese  Head Head: normocephalic  Neck *Thyroid: no enlargement, no nodules, non-tender  Lymph Nodes *Palpation: non-tender supraclavicular nodes, non-tender axillary nodes, non-tender inguinal nodes  Cardiovascular *Auscultation: RRR, no murmur  Lungs *Respiratory Effort: no accessory muscle usage, no intercostal retractions *Auscultation: clear to auscultation, no wheezing, no rales/crackles, no rhonchi  *Breast Bilateral: no skin changes, nipple appearance: normal, no abnormal nipple secretions, no tenderness, no masses palpable  Abdomen *Inspection/Palpation/Auscultation: non-distended, no tenderness, no rebound, no guarding, soft  Female Genitalia *Examination: limited by habitus Vulva: no masses, no atrophy, no lesions Mons: normal Labia Majora: normal, no erythema, no excoriation, no atrophy, no discoloration, no lesions Labia Minora: normal, no erythema, no excoriation, no atrophy, no discoloration, no lesions Introitus: normal *Vagina: normal, no discharge, no blood present, no erythema, no atrophy, no lesions, no ulcers, no masses, no tenderness *Cervix: grossly normal, no lesions, no discharge, no bleeding, no cervical motion tenderness *Uterus: normal size, normal contour, midline, mobile, non-tender, anteverted *Urethral Meatus/ Urethra: normal meatus *Adnexa/Parametria: no mass palpable, no tenderness  Extremities Legs: no calf tenderness  Neurological System Impressions: motor: no deficits, sensory: no deficits  Psychiatric *Orientation: to person, to place, to time *Mood and Affect: active and  alert, normal mood, normal affect Procedure Documentation   No results found for this or any previous visit (from the past 24 hour(s)).  No results found.  Assessment/Plan: 51Y P1 with recurrent postmenopausal bleeding - Plan: hysteroscopy, dilation and curettage, possible polypectomy - Informed consent obtained. Reviewed risks of infection, bleeding, uterine perforation, failure to achieve desired results. All questions answered  Rowland Lathe 03/30/2022, 5:22 PM

## 2022-03-31 ENCOUNTER — Other Ambulatory Visit: Payer: Self-pay

## 2022-03-31 ENCOUNTER — Encounter (HOSPITAL_BASED_OUTPATIENT_CLINIC_OR_DEPARTMENT_OTHER): Payer: Self-pay | Admitting: Obstetrics and Gynecology

## 2022-03-31 ENCOUNTER — Ambulatory Visit (HOSPITAL_BASED_OUTPATIENT_CLINIC_OR_DEPARTMENT_OTHER): Payer: 59 | Admitting: Anesthesiology

## 2022-03-31 ENCOUNTER — Encounter (HOSPITAL_BASED_OUTPATIENT_CLINIC_OR_DEPARTMENT_OTHER)
Admission: RE | Disposition: A | Discharge status: Home / Self Care | Payer: Self-pay | Source: Ambulatory Visit | Attending: Obstetrics and Gynecology

## 2022-03-31 ENCOUNTER — Ambulatory Visit (HOSPITAL_BASED_OUTPATIENT_CLINIC_OR_DEPARTMENT_OTHER)
Admission: RE | Admit: 2022-03-31 | Discharge: 2022-03-31 | Disposition: A | Discharge status: Home / Self Care | Payer: 59 | Source: Ambulatory Visit | Attending: Obstetrics and Gynecology | Admitting: Obstetrics and Gynecology

## 2022-03-31 DIAGNOSIS — G4730 Sleep apnea, unspecified: Secondary | ICD-10-CM | POA: Insufficient documentation

## 2022-03-31 DIAGNOSIS — N854 Malposition of uterus: Secondary | ICD-10-CM | POA: Insufficient documentation

## 2022-03-31 DIAGNOSIS — G4733 Obstructive sleep apnea (adult) (pediatric): Secondary | ICD-10-CM | POA: Diagnosis not present

## 2022-03-31 DIAGNOSIS — Z79890 Hormone replacement therapy (postmenopausal): Secondary | ICD-10-CM | POA: Diagnosis not present

## 2022-03-31 DIAGNOSIS — I10 Essential (primary) hypertension: Secondary | ICD-10-CM | POA: Insufficient documentation

## 2022-03-31 DIAGNOSIS — Z9989 Dependence on other enabling machines and devices: Secondary | ICD-10-CM

## 2022-03-31 DIAGNOSIS — N950 Postmenopausal bleeding: Secondary | ICD-10-CM | POA: Diagnosis present

## 2022-03-31 DIAGNOSIS — Z01818 Encounter for other preprocedural examination: Secondary | ICD-10-CM

## 2022-03-31 HISTORY — PX: DILATATION & CURETTAGE/HYSTEROSCOPY WITH MYOSURE: SHX6511

## 2022-03-31 LAB — CBC
HCT: 42 % (ref 36.0–46.0)
Hemoglobin: 13.7 g/dL (ref 12.0–15.0)
MCH: 30.1 pg (ref 26.0–34.0)
MCHC: 32.6 g/dL (ref 30.0–36.0)
MCV: 92.3 fL (ref 80.0–100.0)
Platelets: 340 10*3/uL (ref 150–400)
RBC: 4.55 MIL/uL (ref 3.87–5.11)
RDW: 13.8 % (ref 11.5–15.5)
WBC: 8.2 10*3/uL (ref 4.0–10.5)
nRBC: 0 % (ref 0.0–0.2)

## 2022-03-31 LAB — POCT PREGNANCY, URINE: Preg Test, Ur: NEGATIVE

## 2022-03-31 LAB — TYPE AND SCREEN
ABO/RH(D): O POS
Antibody Screen: NEGATIVE

## 2022-03-31 SURGERY — DILATATION & CURETTAGE/HYSTEROSCOPY WITH MYOSURE
Anesthesia: General | Site: Vagina

## 2022-03-31 MED ORDER — PROPOFOL 10 MG/ML IV BOLUS
INTRAVENOUS | Status: AC
  Filled 2022-03-31: qty 20

## 2022-03-31 MED ORDER — DEXAMETHASONE SODIUM PHOSPHATE 10 MG/ML IJ SOLN
INTRAMUSCULAR | Status: DC | PRN
  Administered 2022-03-31: 5 mg via INTRAVENOUS

## 2022-03-31 MED ORDER — MEPERIDINE HCL 25 MG/ML IJ SOLN: 6.2500 mg | INTRAMUSCULAR | Status: DC | PRN

## 2022-03-31 MED ORDER — LACTATED RINGERS IV SOLN: INTRAVENOUS | Status: DC

## 2022-03-31 MED ORDER — KETOROLAC TROMETHAMINE 30 MG/ML IJ SOLN
INTRAMUSCULAR | Status: DC | PRN
  Administered 2022-03-31: 30 mg via INTRAVENOUS

## 2022-03-31 MED ORDER — LIDOCAINE HCL 1 % IJ SOLN
INTRAMUSCULAR | Status: DC | PRN
  Administered 2022-03-31: 10 mL

## 2022-03-31 MED ORDER — ONDANSETRON HCL 4 MG/2ML IJ SOLN
INTRAMUSCULAR | Status: DC | PRN
  Administered 2022-03-31: 4 mg via INTRAVENOUS

## 2022-03-31 MED ORDER — LIDOCAINE 2% (20 MG/ML) 5 ML SYRINGE
INTRAMUSCULAR | Status: DC | PRN
  Administered 2022-03-31: 60 mg via INTRAVENOUS
  Administered 2022-03-31: 5 mg via INTRAVENOUS

## 2022-03-31 MED ORDER — SCOPOLAMINE 1 MG/3DAYS TD PT72
MEDICATED_PATCH | TRANSDERMAL | Status: AC
  Filled 2022-03-31: qty 1

## 2022-03-31 MED ORDER — ACETAMINOPHEN 500 MG PO TABS
1000.0000 mg | ORAL_TABLET | ORAL | Status: AC
  Administered 2022-03-31: 1000 mg via ORAL

## 2022-03-31 MED ORDER — FENTANYL CITRATE (PF) 100 MCG/2ML IJ SOLN
INTRAMUSCULAR | Status: AC
  Filled 2022-03-31: qty 2

## 2022-03-31 MED ORDER — PROMETHAZINE HCL 25 MG/ML IJ SOLN: 6.2500 mg | INTRAMUSCULAR | Status: DC | PRN

## 2022-03-31 MED ORDER — ACETAMINOPHEN 500 MG PO TABS
ORAL_TABLET | ORAL | Status: AC
  Filled 2022-03-31: qty 2

## 2022-03-31 MED ORDER — LIDOCAINE HCL (PF) 2 % IJ SOLN
INTRAMUSCULAR | Status: AC
  Filled 2022-03-31: qty 5

## 2022-03-31 MED ORDER — HYDROMORPHONE HCL 1 MG/ML IJ SOLN
INTRAMUSCULAR | Status: AC
  Filled 2022-03-31: qty 1

## 2022-03-31 MED ORDER — OXYCODONE HCL 5 MG PO TABS: 5.0000 mg | ORAL_TABLET | Freq: Once | ORAL | Status: DC | PRN

## 2022-03-31 MED ORDER — MIDAZOLAM HCL 2 MG/2ML IJ SOLN
INTRAMUSCULAR | Status: AC
  Filled 2022-03-31: qty 2

## 2022-03-31 MED ORDER — KETOROLAC TROMETHAMINE 30 MG/ML IJ SOLN
INTRAMUSCULAR | Status: AC
  Filled 2022-03-31: qty 1

## 2022-03-31 MED ORDER — LIDOCAINE HCL 1 % IJ SOLN
INTRAMUSCULAR | Status: AC
  Filled 2022-03-31: qty 20

## 2022-03-31 MED ORDER — SODIUM CHLORIDE 0.9 % IR SOLN
Status: DC | PRN
  Administered 2022-03-31: 3000 mL

## 2022-03-31 MED ORDER — SCOPOLAMINE 1 MG/3DAYS TD PT72
1.0000 | MEDICATED_PATCH | TRANSDERMAL | Status: DC
  Administered 2022-03-31: 1.5 mg via TRANSDERMAL

## 2022-03-31 MED ORDER — HYDROMORPHONE HCL 1 MG/ML IJ SOLN
0.2500 mg | INTRAMUSCULAR | Status: DC | PRN
  Administered 2022-03-31 (×5): 0.25 mg via INTRAVENOUS

## 2022-03-31 MED ORDER — PROPOFOL 10 MG/ML IV BOLUS
INTRAVENOUS | Status: DC | PRN
  Administered 2022-03-31: 200 mg via INTRAVENOUS
  Administered 2022-03-31: 5 mg via INTRAVENOUS

## 2022-03-31 MED ORDER — FENTANYL CITRATE (PF) 100 MCG/2ML IJ SOLN
INTRAMUSCULAR | Status: DC | PRN
  Administered 2022-03-31: 50 ug via INTRAVENOUS

## 2022-03-31 MED ORDER — MIDAZOLAM HCL 5 MG/5ML IJ SOLN
INTRAMUSCULAR | Status: DC | PRN
  Administered 2022-03-31: 2 mg via INTRAVENOUS

## 2022-03-31 MED ORDER — OXYCODONE HCL 5 MG/5ML PO SOLN: 5.0000 mg | Freq: Once | ORAL | Status: DC | PRN

## 2022-03-31 SURGICAL SUPPLY — 16 items
CATH ROBINSON RED A/P 16FR (CATHETERS) ×1 IMPLANT
DEVICE MYOSURE LITE (MISCELLANEOUS) IMPLANT
DEVICE MYOSURE REACH (MISCELLANEOUS) IMPLANT
DRSG TELFA 3X8 NADH STRL (GAUZE/BANDAGES/DRESSINGS) ×1 IMPLANT
GAUZE 4X4 16PLY ~~LOC~~+RFID DBL (SPONGE) ×1 IMPLANT
GLOVE BIO SURGEON STRL SZ 6 (GLOVE) ×1 IMPLANT
GLOVE BIOGEL PI IND STRL 6.5 (GLOVE) ×1 IMPLANT
GOWN STRL REUS W/ TWL LRG LVL3 (GOWN DISPOSABLE) ×1 IMPLANT
GOWN STRL REUS W/TWL LRG LVL3 (GOWN DISPOSABLE) ×1
KIT PROCEDURE FLUENT (KITS) ×1 IMPLANT
KIT TURNOVER CYSTO (KITS) ×1 IMPLANT
PACK VAGINAL MINOR WOMEN LF (CUSTOM PROCEDURE TRAY) ×1 IMPLANT
PAD OB MATERNITY 4.3X12.25 (PERSONAL CARE ITEMS) ×1 IMPLANT
SEAL ROD LENS SCOPE MYOSURE (ABLATOR) ×1 IMPLANT
TOWEL OR 17X26 10 PK STRL BLUE (TOWEL DISPOSABLE) ×1 IMPLANT
UNDERPAD 30X36 HEAVY ABSORB (UNDERPADS AND DIAPERS) ×1 IMPLANT

## 2022-03-31 NOTE — Brief Op Note (Signed)
03/31/2022  12:49 PM  PATIENT:  Amanda Baker  51 y.o. female  PRE-OPERATIVE DIAGNOSIS:  post menopausal bleeding  POST-OPERATIVE DIAGNOSIS:  post menopausal bleeding, polypoid tissue  PROCEDURE:  Hysteroscopy, dilation and curettage, polypectomy  SURGEON:  Surgeon(s) and Role:    * Rowland Lathe, MD - Primary  ANESTHESIA:   local and general  EBL:  5 mL   BLOOD ADMINISTERED:none  DRAINS: none   LOCAL MEDICATIONS USED:  LIDOCAINE  and Amount: 10 ml  SPECIMEN:  Source of Specimen:  endometrial curettings and polypoid tissue  DISPOSITION OF SPECIMEN:  PATHOLOGY  COUNTS:  YES  TOURNIQUET:  * No tourniquets in log *  DICTATION: .Note written in EPIC  PLAN OF CARE: Discharge to home after PACU  PATIENT DISPOSITION:  PACU - hemodynamically stable.   Delay start of Pharmacological VTE agent (>24hrs) due to surgical blood loss or risk of bleeding: not applicable

## 2022-03-31 NOTE — Anesthesia Procedure Notes (Signed)
Procedure Name: LMA Insertion Date/Time: 03/31/2022 12:13 PM  Performed by: Rogers Blocker, CRNAPre-anesthesia Checklist: Patient identified, Emergency Drugs available, Suction available and Patient being monitored Patient Re-evaluated:Patient Re-evaluated prior to induction Oxygen Delivery Method: Circle System Utilized Preoxygenation: Pre-oxygenation with 100% oxygen Induction Type: IV induction Ventilation: Mask ventilation without difficulty LMA: LMA inserted LMA Size: 4.0 Number of attempts: 1 Placement Confirmation: positive ETCO2 Tube secured with: Tape Dental Injury: Teeth and Oropharynx as per pre-operative assessment

## 2022-03-31 NOTE — Anesthesia Preprocedure Evaluation (Signed)
Anesthesia Evaluation  Patient identified by MRN, date of birth, ID band Patient awake    Reviewed: Allergy & Precautions, NPO status , Patient's Chart, lab work & pertinent test results  History of Anesthesia Complications (+) PONV and history of anesthetic complications  Airway Mallampati: III  TM Distance: >3 FB Neck ROM: Full    Dental no notable dental hx.    Pulmonary sleep apnea and Continuous Positive Airway Pressure Ventilation ,    Pulmonary exam normal breath sounds clear to auscultation       Cardiovascular hypertension, Pt. on medications Normal cardiovascular exam Rhythm:Regular Rate:Normal     Neuro/Psych PSYCHIATRIC DISORDERS Anxiety Depression negative neurological ROS     GI/Hepatic negative GI ROS, Neg liver ROS,   Endo/Other  Morbid obesity  Renal/GU negative Renal ROS     Musculoskeletal negative musculoskeletal ROS (+)   Abdominal (+) + obese,   Peds  Hematology negative hematology ROS (+)   Anesthesia Other Findings stress urinary incontinence  Reproductive/Obstetrics                             Anesthesia Physical  Anesthesia Plan  ASA: 3  Anesthesia Plan: General   Post-op Pain Management: Toradol IV (intra-op)*   Induction: Intravenous  PONV Risk Score and Plan: 4 or greater and Ondansetron, Dexamethasone, Midazolam, Scopolamine patch - Pre-op, Treatment may vary due to age or medical condition and Droperidol  Airway Management Planned: LMA  Additional Equipment:   Intra-op Plan:   Post-operative Plan: Extubation in OR  Informed Consent: I have reviewed the patients History and Physical, chart, labs and discussed the procedure including the risks, benefits and alternatives for the proposed anesthesia with the patient or authorized representative who has indicated his/her understanding and acceptance.     Dental advisory given  Plan Discussed  with: CRNA  Anesthesia Plan Comments:         Anesthesia Quick Evaluation

## 2022-03-31 NOTE — Discharge Instructions (Addendum)

## 2022-03-31 NOTE — Transfer of Care (Signed)
Immediate Anesthesia Transfer of Care Note  Patient: Amanda Baker  Procedure(s) Performed: DILATATION & CURETTAGE/HYSTEROSCOPY WITH MYOSURE LITE (Vagina )  Patient Location: PACU  Anesthesia Type:General  Level of Consciousness: awake, alert , oriented and patient cooperative  Airway & Oxygen Therapy: Patient Spontanous Breathing  Post-op Assessment: Report given to RN and Post -op Vital signs reviewed and stable  Post vital signs: Reviewed and stable  Last Vitals:  Vitals Value Taken Time  BP 141/79 03/31/22 1245  Temp    Pulse 67 03/31/22 1246  Resp 14 03/31/22 1246  SpO2 93 % 03/31/22 1246  Vitals shown include unvalidated device data.  Last Pain:  Vitals:   03/31/22 1037  TempSrc: Oral  PainSc: 0-No pain      Patients Stated Pain Goal: 5 (26/33/35 4562)  Complications: No notable events documented.

## 2022-03-31 NOTE — Op Note (Signed)
03/31/2022   12:49 PM   PATIENT:  Amanda Baker  51 y.o. female   PRE-OPERATIVE DIAGNOSIS:  recurrent post menopausal bleeding   POST-OPERATIVE DIAGNOSIS:  recurrent post menopausal bleeding, polypoid tissue   PROCEDURE:  Hysteroscopy, dilation and curettage, polypectomy   SURGEON:  Surgeon(s) and Role:    * Rowland Lathe, MD - Primary   ANESTHESIA:   local and general   EBL:  5 mL    BLOOD ADMINISTERED:none   DRAINS: none    LOCAL MEDICATIONS USED:  LIDOCAINE  and Amount: 10 ml   SPECIMEN:  Source of Specimen:  endometrial curettings and polypoid tissue   DISPOSITION OF SPECIMEN:  PATHOLOGY   COUNTS:  YES   PLAN OF CARE: Discharge to home after PACU   PATIENT DISPOSITION:  PACU - hemodynamically stable.   FINDINGS: anteverted uterus sounded to 9cm. On hysterscopic view, bilateral ostia visualized. Polypoid mass noted with stalk at lower posterior uterine wall. Otherwise thick pink appearing endometrium. Total fluid deficit 247m.  PROCEDURE IN DETAIL:  The patient was appropriately consented and taken to the operating room where anesthesia was administered without difficulty. Thromboguards were placed and connected. She was placed in the dorsal lithotomy position in stirrups. She was examined under anesthesia and found to have an anteverted uterus. The patient was then prepped and draped in normal sterile fashion. A long speculum was inserted into the vagina. A single-tooth tenaculum was used to grasp the anterior lip of the cervix. A paracervical block was obtained by injecting a total of 151mof 1% lidocaine at the 4 and 8 o'clock positions at the cervicovaginal junction. The uterus was sounded to 9cm. The cervical os was sequentially dilated using Pratt dilators to accommodate the hysteroscope. The hysteroscope was introduced under direct visualization and the uterus was distended with normal saline. Bilateral ostia were visualized. Findings as noted above. The  Myosure Lite device was used to resect the polypoid tissue and obtain a global endometrial sample. Specimen was collected for pathology. Hemostasis was noted in the uterine cavity. The hysteroscope was removed. A sharp curettage was performed until a gritty feel was noted and specimen was collected on Telfa. The tenaculum was removed from the cervix and hemostasis was achieved at the puncture sites with silver nitrate. The patient tolerated the procedure well. The instrument and sponge counts were correct times two. The patient was awakened from anesthesia and taken to the recovery room in stable condition.  MiIrene PapMD 03/31/22 12:53 PM

## 2022-04-01 ENCOUNTER — Encounter (HOSPITAL_BASED_OUTPATIENT_CLINIC_OR_DEPARTMENT_OTHER): Payer: Self-pay | Admitting: Obstetrics and Gynecology

## 2022-04-01 LAB — SURGICAL PATHOLOGY

## 2022-04-01 NOTE — Anesthesia Postprocedure Evaluation (Signed)
Anesthesia Post Note  Patient: Amanda Baker  Procedure(s) Performed: DILATATION & CURETTAGE/HYSTEROSCOPY WITH MYOSURE LITE (Vagina )     Patient location during evaluation: PACU Anesthesia Type: General Level of consciousness: awake and alert Pain management: pain level controlled Vital Signs Assessment: post-procedure vital signs reviewed and stable Respiratory status: spontaneous breathing, nonlabored ventilation and respiratory function stable Cardiovascular status: blood pressure returned to baseline and stable Postop Assessment: no apparent nausea or vomiting Anesthetic complications: no   No notable events documented.  Last Vitals:  Vitals:   03/31/22 1340 03/31/22 1400  BP: 116/60 132/68  Pulse: 76 64  Resp: 16 16  Temp:  37 C  SpO2: 100% 96%    Last Pain:  Vitals:   03/31/22 1400  TempSrc:   PainSc: 0-No pain                 Lynda Rainwater

## 2022-05-13 ENCOUNTER — Ambulatory Visit: Payer: 59 | Admitting: Obstetrics and Gynecology

## 2023-03-06 ENCOUNTER — Other Ambulatory Visit: Payer: Self-pay | Admitting: Obstetrics and Gynecology

## 2023-03-06 DIAGNOSIS — Z1231 Encounter for screening mammogram for malignant neoplasm of breast: Secondary | ICD-10-CM

## 2023-07-27 ENCOUNTER — Emergency Department (HOSPITAL_BASED_OUTPATIENT_CLINIC_OR_DEPARTMENT_OTHER): Payer: 59

## 2023-07-27 ENCOUNTER — Other Ambulatory Visit: Payer: Self-pay

## 2023-07-27 ENCOUNTER — Encounter (HOSPITAL_BASED_OUTPATIENT_CLINIC_OR_DEPARTMENT_OTHER): Payer: Self-pay | Admitting: Emergency Medicine

## 2023-07-27 ENCOUNTER — Other Ambulatory Visit: Payer: Self-pay | Admitting: Obstetrics and Gynecology

## 2023-07-27 ENCOUNTER — Emergency Department (HOSPITAL_BASED_OUTPATIENT_CLINIC_OR_DEPARTMENT_OTHER)
Admission: EM | Admit: 2023-07-27 | Discharge: 2023-07-27 | Disposition: A | Discharge status: Home / Self Care | Payer: 59 | Attending: Emergency Medicine | Admitting: Emergency Medicine

## 2023-07-27 DIAGNOSIS — R109 Unspecified abdominal pain: Secondary | ICD-10-CM | POA: Diagnosis present

## 2023-07-27 DIAGNOSIS — I10 Essential (primary) hypertension: Secondary | ICD-10-CM | POA: Diagnosis not present

## 2023-07-27 DIAGNOSIS — N3001 Acute cystitis with hematuria: Secondary | ICD-10-CM | POA: Diagnosis not present

## 2023-07-27 DIAGNOSIS — N3000 Acute cystitis without hematuria: Secondary | ICD-10-CM

## 2023-07-27 DIAGNOSIS — R1031 Right lower quadrant pain: Secondary | ICD-10-CM

## 2023-07-27 LAB — URINALYSIS, ROUTINE W REFLEX MICROSCOPIC
Bilirubin Urine: NEGATIVE
Glucose, UA: NEGATIVE mg/dL
Ketones, ur: NEGATIVE mg/dL
Nitrite: NEGATIVE
Protein, ur: NEGATIVE mg/dL
Specific Gravity, Urine: 1.016 (ref 1.005–1.030)
pH: 6.5 (ref 5.0–8.0)

## 2023-07-27 LAB — CBC
HCT: 43.4 % (ref 36.0–46.0)
Hemoglobin: 14.5 g/dL (ref 12.0–15.0)
MCH: 29.9 pg (ref 26.0–34.0)
MCHC: 33.4 g/dL (ref 30.0–36.0)
MCV: 89.5 fL (ref 80.0–100.0)
Platelets: 371 10*3/uL (ref 150–400)
RBC: 4.85 MIL/uL (ref 3.87–5.11)
RDW: 13.2 % (ref 11.5–15.5)
WBC: 9.3 10*3/uL (ref 4.0–10.5)
nRBC: 0 % (ref 0.0–0.2)

## 2023-07-27 LAB — COMPREHENSIVE METABOLIC PANEL
ALT: 14 U/L (ref 0–44)
AST: 14 U/L — ABNORMAL LOW (ref 15–41)
Albumin: 4.4 g/dL (ref 3.5–5.0)
Alkaline Phosphatase: 86 U/L (ref 38–126)
Anion gap: 9 (ref 5–15)
BUN: 13 mg/dL (ref 6–20)
CO2: 24 mmol/L (ref 22–32)
Calcium: 9.5 mg/dL (ref 8.9–10.3)
Chloride: 105 mmol/L (ref 98–111)
Creatinine, Ser: 0.7 mg/dL (ref 0.44–1.00)
GFR, Estimated: >60 mL/min (ref >60)
Glucose, Bld: 91 mg/dL (ref 70–99)
Potassium: 3.9 mmol/L (ref 3.5–5.1)
Sodium: 138 mmol/L (ref 135–145)
Total Bilirubin: 0.6 mg/dL (ref 0.0–1.2)
Total Protein: 7.8 g/dL (ref 6.5–8.1)

## 2023-07-27 LAB — PREGNANCY, URINE: Preg Test, Ur: NEGATIVE

## 2023-07-27 LAB — LIPASE, BLOOD: Lipase: 27 U/L (ref 11–51)

## 2023-07-27 MED ORDER — IOHEXOL 300 MG/ML  SOLN
100.0000 mL | Freq: Once | INTRAMUSCULAR | Status: AC | PRN
  Administered 2023-07-27: 100 mL via INTRAVENOUS

## 2023-07-27 MED ORDER — OXYCODONE-ACETAMINOPHEN 5-325 MG PO TABS
1.0000 | ORAL_TABLET | ORAL | Status: AC | PRN
  Administered 2023-07-27 (×2): 1 via ORAL
  Filled 2023-07-27 (×2): qty 1

## 2023-07-27 MED ORDER — SULFAMETHOXAZOLE-TRIMETHOPRIM 800-160 MG PO TABS
1.0000 | ORAL_TABLET | Freq: Once | ORAL | Status: AC
  Administered 2023-07-27: 1 via ORAL
  Filled 2023-07-27: qty 1

## 2023-07-27 MED ORDER — PHENAZOPYRIDINE HCL 200 MG PO TABS: 200.0000 mg | ORAL_TABLET | Freq: Three times a day (TID) | ORAL | 0 refills | Status: AC

## 2023-07-27 MED ORDER — PHENAZOPYRIDINE HCL 100 MG PO TABS
95.0000 mg | ORAL_TABLET | Freq: Once | ORAL | Status: AC
  Administered 2023-07-27: 100 mg via ORAL
  Filled 2023-07-27: qty 1

## 2023-07-27 MED ORDER — SULFAMETHOXAZOLE-TRIMETHOPRIM 800-160 MG PO TABS: 1.0000 | ORAL_TABLET | Freq: Two times a day (BID) | ORAL | 0 refills | Status: AC

## 2023-07-27 MED ORDER — ONDANSETRON 4 MG PO TBDP
4.0000 mg | ORAL_TABLET | Freq: Once | ORAL | Status: AC
  Administered 2023-07-27: 4 mg via ORAL
  Filled 2023-07-27: qty 1

## 2023-07-27 NOTE — ED Provider Notes (Signed)
[place] EMERGENCY DEPARTMENT AT [facility] Provider Note   CSN: [id number] Arrival date & time: 07/27/23  1205     History  Chief Complaint  Patient presents with   Abdominal Pain    Amanda Baker is a 53 y.o. female.   Abdominal Pain   53 year old female presents emergency department with complaints of right lower abdominal pain.  Patient's reports symptoms present for the past 2 weeks or so.  Denies radiation of pain.  States the pain is worsened with certain positions when lying on right side as well as certain movements.  Was seen at the OB/GYN earlier today and had a negative pelvic ultrasound that UA with positive leukocytes as well as blood.  Presents emergency department acute concern for possible pyelonephritis versus nephrolithiasis.  Patient with history of kidney stones but states that she is unaware of whether or not this is similar given duration of time since previous episode.  Denies any fever, nausea, vomiting, change in bowel habits.  Does report burning after urinating but states that this has been going on for several months without any acute change over the past couple of weeks.  Denies any noticeable hematuria.  Past medical history significant for hypertension, kidney stone, melanoma cancer, OSA, hyperlipidemia  Home Medications Prior to Admission medications   Medication Sig Start Date End Date Taking? Authorizing Provider  acetaminophen (TYLENOL) 325 MG tablet Take 650 mg by mouth every 6 (six) hours as needed.    [provider]  busPIRone (BUSPAR) 10 MG tablet Take 15 mg by mouth 3 (three) times daily. 1 in am  and  200 pm    [provider]  estradiol (CLIMARA - DOSED IN MG/24 HR) 0.05 mg/24hr patch Place 0.05 mg onto the skin once a week. Change on monday    [provider]  ibuprofen (ADVIL) 200 MG tablet Take 600 mg by mouth every 6 (six) hours as needed.    [provider]  lurasidone (LATUDA) 20 MG  TABS tablet Take 40 mg by mouth at bedtime.    [provider]  nystatin (MYCOSTATIN/NYSTOP) powder Apply 1 application topically 3 (three) times daily. Apply to folds under abdomen and other affected areas. Patient taking differently: Apply 1 application  topically as needed. Apply to folds under abdomen and other affected areas. 07/18/21   Marguerita Beards, MD  progesterone (PROMETRIUM) 100 MG capsule Take 200 mg by mouth at bedtime.    [provider]  solifenacin (VESICARE) 10 MG tablet Take 1 tablet (10 mg total) by mouth daily. Patient not taking: Reported on 03/19/2022 09/18/21   Marguerita Beards, MD  traZODone (DESYREL) 50 MG tablet Take 50 mg by mouth at bedtime.    [provider]  vortioxetine HBr (TRINTELLIX) 20 MG TABS tablet Take 20 mg by mouth daily.    [provider]      Allergies    Penicillins, Erythromycin, Codeine, and Lisinopril    Review of Systems   Review of Systems  Gastrointestinal:  Positive for abdominal pain.  All other systems reviewed and are negative.   Physical Exam Updated Vital Signs BP (!) 178/90 (BP Location: Right Arm)   Pulse 95   Temp 98.3 F (36.8 C) (Oral)   Resp 20   SpO2 97%  Physical Exam Vitals and nursing note reviewed.  Constitutional:      General: She is not in acute distress.    Appearance: She is well-developed.  HENT:  Head: Normocephalic and atraumatic.  Eyes:     Conjunctiva/sclera: Conjunctivae normal.  Cardiovascular:     Rate and Rhythm: Normal rate and regular rhythm.     Heart sounds: No murmur heard. Pulmonary:     Effort: Pulmonary effort is normal. No respiratory distress.     Breath sounds: Normal breath sounds.  Abdominal:     Palpations: Abdomen is soft.     Tenderness: There is abdominal tenderness in the right lower quadrant. There is no right CVA tenderness or left CVA tenderness.  Musculoskeletal:        General: No swelling.     Cervical back: Neck  supple.  Skin:    General: Skin is warm and dry.     Capillary Refill: Capillary refill takes less than 2 seconds.  Neurological:     Mental Status: She is alert.  Psychiatric:        Mood and Affect: Mood normal.     ED Results / Procedures / Treatments   Labs (all labs ordered are listed, but only abnormal results are displayed) Labs Reviewed  COMPREHENSIVE METABOLIC PANEL - Abnormal; Notable for the following components:      Result Value   AST 14 (*)    All other components within normal limits  URINALYSIS, ROUTINE W REFLEX MICROSCOPIC - Abnormal; Notable for the following components:   Hgb urine dipstick MODERATE (*)    Leukocytes,Ua MODERATE (*)    Bacteria, UA RARE (*)    All other components within normal limits  LIPASE, BLOOD  CBC  PREGNANCY, URINE    EKG None  Radiology No results found.  Procedures Procedures    Medications Ordered in ED Medications  oxyCODONE-acetaminophen (PERCOCET/ROXICET) 5-325 MG per tablet 1 tablet (1 tablet Oral Given 07/27/23 1541)  ondansetron (ZOFRAN-ODT) disintegrating tablet 4 mg (4 mg Oral Given 07/27/23 1540)    ED Course/ Medical Decision Making/ A&P                                 Medical Decision Making Amount and/or Complexity of Data Reviewed Labs: ordered. Radiology: ordered.  Risk Prescription drug management.   This patient presents to the ED for concern of abdominal pain, this involves an extensive number of treatment options, and is a complaint that carries with it a high risk of complications and morbidity.  The differential diagnosis includes pyelonephritis, nephrolithiasis, diverticulitis, appendicitis, SBO/LBO, volvulus, cystitis   Co morbidities that complicate the patient evaluation  See HPI   Additional history obtained:  Additional history obtained from EMR External records from outside source obtained and reviewed including hospital records   Lab Tests:  I Ordered, and personally  interpreted labs.  The pertinent results include: No leukocytosis.  No evidence of anemia.  Platelets within range.  No Electra abnormalities.  No transaminitis.  No renal dysfunction.  UA with moderate leukocyte, rare bacteria, 11-20 WBC, 6-10 RBCs.  Lipase within normal limits.  Urine pregnancy negative.   Imaging Studies ordered:  I ordered imaging studies including CT abdomen pelvis pending   Cardiac Monitoring: / EKG:  The patient was maintained on a cardiac monitor.  I personally viewed and interpreted the cardiac monitored which showed an underlying rhythm of: Sinus rhythm   Consultations Obtained:   N/a   Problem List / ED Course / Critical interventions / Medication management  Right lower abdominal pain I ordered medication including Zofran, Percocet   Reevaluation  of the patient after these medicines showed that the patient improved I have reviewed the patients home medicines and have made adjustments as needed   Social Determinants of Health:  Denies tobacco, licit drug use   Test / Admission - Considered:  Right lower abdominal pain Vitals signs significant for hyper blood pressure 149/58. Otherwise within normal range and stable throughout visit. Laboratory/imaging studies significant for: See above 53 year old female presents emergency department with complaints of right lower abdominal pain present for the past couple of weeks.  On exam, tenderness right lower quadrant of abdomen without CVA tenderness.  Laboratory studies reassuring besides UA with rare bacteria, 1120 WBCs, 6-10 RBCs and moderate leukocytes.  Awaiting CT imaging of patient abdomen/pelvis, concern for possible pyelonephritis versus nephrolithiasis from appendicitis versus other etiology of patient's abdominal pain.  Attempt to change, patient care handed off to [employer].  Patient stable upon shift change.        Final Clinical Impression(s) / ED Diagnoses Final diagnoses:  None     Rx / DC Orders ED Discharge Orders     None         Peter Garter, [place] 07/27/23 1859    Vanetta Mulders, MD 07/30/23 1331

## 2023-07-27 NOTE — ED Provider Notes (Signed)
Patient care taken over at shift change. Disposition pending CT results. Physical Exam  BP (!) 149/58 (BP Location: Right Arm)   Pulse 87   Temp 98.5 F (36.9 C) (Oral)   Resp 18   SpO2 99%   Physical Exam  Procedures  Procedures  ED Course / MDM    Medical Decision Making Amount and/or Complexity of Data Reviewed Labs: ordered. Radiology: ordered.  Risk Prescription drug management.   CT showed no acute processes in the abdomen or pelvis.  Did show incidental finding of a soft tissue mass on the right lung.  Patient and family member at bedside made aware of incidental findings.  Discussed results with patient.  All questions were answered.  First dose of antibiotic given in the ED tonight as well as Pyridium.  Prescription sent to the pharmacy.  Advised close follow-up with primary care provider for reevaluation of symptoms as well as nonemergent CT chest for reevaluation of lung mass.  Return precautions provided.       Maxwell Marion, PA-C 07/27/23 2049    Vanetta Mulders, MD 07/30/23 [id number]

## 2023-07-27 NOTE — Discharge Instructions (Addendum)
 As discussed, you are positive for urinary tract infection.  Take Bactrim  twice a day for the next 7 days.  You given the first dose in the ED tonight.  I sent prescriptions to your pharmacy for the antibiotic as well as for Pyridium .  You can take this up to 3 times a day as needed for urinary discomfort.  This medication can turn your urine orange as a side effect.  Additionally, your urine has been sent for culture.  You will receive a call from the hospital if the antibiotic you are on is not susceptible to the bacteria causing the infection.  You will be switched to different antibiotic at that time.  If you not hear from the hospital, continue the Bactrim  as prescribed.  Your CT does not show any acute processes causing your pain.  There was an incidental finding of soft tissue mass on your right lung.  A nonemergent CT chest is recommended for further evaluation.  This can be done with your primary care provider.  Get help right away if: You have very bad pain in your back or lower belly. You faint.

## 2023-07-27 NOTE — ED Notes (Signed)
 Patient unable to provide urine sample at this time. Specimen cup given to patient and reviewed instructions for clean catch collection.

## 2023-07-27 NOTE — ED Triage Notes (Signed)
 RLQ pain x 2 weeks.worse when lying on right side. Seen by OB/GYN UA -+leuko, +blood US  ovary normal.

## 2023-07-27 NOTE — ED Notes (Addendum)
 Discharge paperwork given and verbally understood.... Pt aware and understood no drinking/driving due to the meds that were given.Marland KitchenMarland Kitchen

## 2023-07-28 LAB — URINE CULTURE: Culture: NO GROWTH

## 2023-07-31 ENCOUNTER — Other Ambulatory Visit: Payer: Self-pay | Admitting: Family Medicine

## 2023-07-31 DIAGNOSIS — R918 Other nonspecific abnormal finding of lung field: Secondary | ICD-10-CM

## 2023-08-03 ENCOUNTER — Ambulatory Visit
Admission: RE | Admit: 2023-08-03 | Discharge: 2023-08-03 | Disposition: A | Discharge status: Home / Self Care | Payer: 59 | Source: Ambulatory Visit | Attending: Family Medicine | Admitting: Family Medicine

## 2023-08-03 DIAGNOSIS — R918 Other nonspecific abnormal finding of lung field: Secondary | ICD-10-CM

## 2023-08-03 MED ORDER — IOPAMIDOL (ISOVUE-300) INJECTION 61%
100.0000 mL | Freq: Once | INTRAVENOUS | Status: AC | PRN
  Administered 2023-08-03: 75 mL via INTRAVENOUS

## 2023-08-06 ENCOUNTER — Encounter: Payer: Self-pay | Admitting: Emergency Medicine

## 2023-08-06 ENCOUNTER — Ambulatory Visit: Payer: 59 | Admitting: Emergency Medicine

## 2023-08-06 VITALS — BP 144/84 | HR 90 | Ht 61.0 in | Wt 241.4 lb

## 2023-08-06 DIAGNOSIS — F419 Anxiety disorder, unspecified: Secondary | ICD-10-CM | POA: Diagnosis not present

## 2023-08-06 DIAGNOSIS — R918 Other nonspecific abnormal finding of lung field: Secondary | ICD-10-CM | POA: Diagnosis not present

## 2023-08-06 DIAGNOSIS — C439 Malignant melanoma of skin, unspecified: Secondary | ICD-10-CM | POA: Insufficient documentation

## 2023-08-06 DIAGNOSIS — C4330 Malignant melanoma of unspecified part of face: Secondary | ICD-10-CM | POA: Diagnosis not present

## 2023-08-06 NOTE — Assessment & Plan Note (Signed)
Right Hilar Mass Large right hilar mass situated centrally in the right middle lobe but also involving the lower lobe measuring 4.5 by 3.5 by 3.9 cm. No spiculation or calcification. No evidence of endobronchial lesion, lobar collapse or other nodules. Differential diagnosis includes benign tumor, infection, or malignancy. Given the patient's history of melanoma, there is a concern for metastasis. -Perform bronchoscopy with biopsy to establish a tissue diagnosis. -Plan for follow-up in 2-3 days to discuss biopsy results.

## 2023-08-06 NOTE — H&P (View-Only) (Signed)
 Subjective:    Patient ID: Amanda Baker, female    DOB: 13-Aug-1970, 53 y.o.   MRN: [id number]  HPI  Amanda Baker "Marcelino Duster" is a 53 year old female who presents with an abnormal CT scan of the chest. She is accompanied by Onalee Hua, her husband. She was referred by her primary care physician for evaluation of an abnormal CT scan of the chest.  She initially presented to the emergency department on July 27, 2023, with abdominal pain and a suspected urinary tract infection. During this evaluation, a CT scan of the abdomen and pelvis incidentally revealed an incompletely imaged right hilar mass. A dedicated CT scan of the chest on August 03, 2023, showed a large right hilar mass located centrally in the right middle lobe and involving the lower lobe, measuring 4.5 x 3.5 x 3.9 cm. The mass appeared solid without spiculation or calcification, and there was no evidence of endobronchial lesion, lobar collapse, or other nodules.  She has no new symptoms such as breathing difficulty, persistent cough, mucus production, hemoptysis, fevers, chills, or unintentional weight loss. She mentions a gradual weight loss over the past three years, attributed to decreased appetite, but not significant enough to be concerning. No systemic illness or new lumps or LAD melanoma resection site.  Her past medical history includes thyroiditis, hypertension, depression, obstructive sleep apnea managed with CPAP, and a history of melanoma removed from her skin in 2020. She has also had basal cell carcinoma and one squamous cell carcinoma removed.  Family history is notable for her natural father having died from non-small cell lung cancer, and her maternal grandmother had COPD. She denies any history of tuberculosis exposure or positive TB tests.  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 She has never smoked and denies significant exposure to fumes,  chemicals, or dusts.   RADIOLOGY CT chest: Large right hilar mass situated centrally in the right middle lobe involving the lower lobe measuring 4.5 x 3.5 x 3.9 cm. No spiculation or calcification. No evidence of endobronchial lesion, lobar collapse, or other nodules. (08/03/2023)  CT abdomen and pelvis: Incompletely imaged right hilar mass (07/27/2023)  Review of Systems As per HPI  Past Medical History:  Diagnosis Date   Anxiety state, unspecified    HISTORY OF Acute thyroiditis 06/16/2002   PROBLEM WITH THYROIDITIS RESOLVED   History of COVID-19 02/2021   FATIGUE/SINUS ISSUES X 7 DAYS ALL ISSUES RESOLVED TOOOK ORAL PO TREATMENT   History of COVID-19 03/05/2022   fatigue and sinus drainage all symptoms resolved, did home test, and dr office pcr test   History of Essential hypertension    no medications in 2 to 3 years per pt as of 03-19-2022   history of Hypercholesteremia    History of kidney stones    X 1- 25 YRS AGO PASSED ON OWN PER PT ON 08-05-2021   Insomnia, unspecified    Major depression    Pt continues to struggle - currently more anxiety than depression. Increasing Fluoxetine may aggrevate the RLS. She will increase 1 mg at a time.   melanoma Cancer (HCC)    SKIN CANCERS melanoma removed x 3  2020   PONV (postoperative nausea and vomiting)    SEVERE!!   PT REQUESTS SCOPOLAMINE PATCH FOR ALL SURGERIES!   Sleep apnea    uses cpap pt does not know settings   Umbilical hernia    Had repair surgery but was complicated  with infection so pt. had a second repair surgery   Wears glasses OR CONTACTS      Family History  Problem Relation Age of Onset   Heart failure Mother    Heart disease Mother    CAD Mother 47   Osteoarthritis Mother    High Cholesterol Mother    Cancer Father        Non-small cell lung cancer   CAD Maternal Grandmother    Hypertension Maternal Grandmother    CAD Maternal Grandfather     - Grandmother (mother's side) had COPD  Social History    Socioeconomic History   Marital status: Married    Spouse name: Not on file   Number of children: Not on file   Years of education: Not on file   Highest education level: Not on file  Occupational History   Not on file  Tobacco Use   Smoking status: Never   Smokeless tobacco: Never  Vaping Use   Vaping status: Never Used  Substance and Sexual Activity   Alcohol use: No   Drug use: No   Sexual activity: Yes  Other Topics Concern   Not on file  Social History Narrative   Not on file   Social Drivers of Health   Financial Resource Strain: Not on file  Food Insecurity: Not on file  Transportation Needs: Not on file  Physical Activity: Not on file  Stress: Not on file  Social Connections: Not on file  Intimate Partner Violence: Not on file     Allergies  Allergen Reactions   Penicillins Anaphylaxis and Other (See Comments)    Throat swelling, ears red   Erythromycin Hives    Severe abdominal cramping   Codeine Hives and Other (See Comments)    Hallucinations, stomach cramps   Lisinopril     Dry cough     Outpatient Medications Prior to Visit  Medication Sig Dispense Refill   acetaminophen (TYLENOL) 325 MG tablet Take 650 mg by mouth every 6 (six) hours as needed.     busPIRone (BUSPAR) 10 MG tablet Take 15 mg by mouth 3 (three) times daily. 1 in am  and  200 pm     estradiol (CLIMARA - DOSED IN MG/24 HR) 0.05 mg/24hr patch Place 0.05 mg onto the skin once a week. Change on monday     traZODone (DESYREL) 50 MG tablet Take 50 mg by mouth at bedtime.     vortioxetine HBr (TRINTELLIX) 20 MG TABS tablet Take 20 mg by mouth daily.     ibuprofen (ADVIL) 200 MG tablet Take 600 mg by mouth every 6 (six) hours as needed. (Patient not taking: Reported on 08/06/2023)     lurasidone (LATUDA) 20 MG TABS tablet Take 40 mg by mouth at bedtime. (Patient not taking: Reported on 08/06/2023)     nystatin (MYCOSTATIN/NYSTOP) powder Apply 1 application topically 3 (three) times daily.  Apply to folds under abdomen and other affected areas. (Patient not taking: Reported on 08/06/2023) 60 g 5   progesterone (PROMETRIUM) 100 MG capsule Take 200 mg by mouth at bedtime. (Patient not taking: Reported on 08/06/2023)     solifenacin (VESICARE) 10 MG tablet Take 1 tablet (10 mg total) by mouth daily. (Patient not taking: Reported on 08/06/2023) 30 tablet 5   No facility-administered medications prior to visit.        Objective:   Physical Exam  Vitals:   08/06/23 1403 08/06/23 1405  BP: (!) 148/70 (!) 144/84  Pulse:  90   SpO2: 99%   Weight: 241 lb 6.4 oz (109.5 kg)   Height: 5\' 1"  (1.549 m)     Gen: Pleasant, obese woman, in no distress,  normal affect  ENT: Well-healed linear scar along the mandible,  mouth clear,  oropharynx clear, no postnasal drip,  Neck: No JVD, no stridor  Lungs: No use of accessory muscles, no crackles or wheezing on normal respiration, no wheeze on forced expiration  Cardiovascular: RRR, heart sounds normal, no murmur or gallops, no peripheral edema  Musculoskeletal: No deformities, no cyanosis or clubbing  Neuro: alert, awake, non focal  Skin: Warm, no lesions or rash      Assessment & Plan:  Hilar mass Right Hilar Mass Large right hilar mass situated centrally in the right middle lobe but also involving the lower lobe measuring 4.5 by 3.5 by 3.9 cm. No spiculation or calcification. No evidence of endobronchial lesion, lobar collapse or other nodules. Differential diagnosis includes benign tumor, infection, or malignancy. Given the patient's history of melanoma, there is a concern for metastasis. -Perform bronchoscopy with biopsy to establish a tissue diagnosis. -Plan for follow-up in 2-3 days to discuss biopsy results.   Melanoma (HCC) History of Melanoma Melanoma removed in 2020 with no additional treatment required. No current evidence of recurrence or metastasis. -Continue regular skin checks and monitoring for  recurrence.  Anxiety Anxiety Patient reports severe anxiety, particularly related to medical results. -Provide clear communication and reassurance during diagnostic process.   Levy Pupa, MD, PhD 08/06/2023, 4:37 PM [facility] [phone] or if no answer before 7:00PM call [phone] For any issues after 7:00PM please call eLink [phone]

## 2023-08-06 NOTE — Assessment & Plan Note (Signed)
Anxiety Patient reports severe anxiety, particularly related to medical results. -Provide clear communication and reassurance during diagnostic process.

## 2023-08-06 NOTE — Progress Notes (Signed)
Subjective:    Patient ID: Amanda Baker, female    DOB: 13-Aug-1970, 53 y.o.   MRN: [id number]  HPI  [name] "Marcelino Duster" is a 53 year old female who presents with an abnormal CT scan of the chest. She is accompanied by Onalee Hua, her husband. She was referred by her primary care physician for evaluation of an abnormal CT scan of the chest.  She initially presented to the emergency department on July 27, 2023, with abdominal pain and a suspected urinary tract infection. During this evaluation, a CT scan of the abdomen and pelvis incidentally revealed an incompletely imaged right hilar mass. A dedicated CT scan of the chest on August 03, 2023, showed a large right hilar mass located centrally in the right middle lobe and involving the lower lobe, measuring 4.5 x 3.5 x 3.9 cm. The mass appeared solid without spiculation or calcification, and there was no evidence of endobronchial lesion, lobar collapse, or other nodules.  She has no new symptoms such as breathing difficulty, persistent cough, mucus production, hemoptysis, fevers, chills, or unintentional weight loss. She mentions a gradual weight loss over the past three years, attributed to decreased appetite, but not significant enough to be concerning. No systemic illness or new lumps or LAD melanoma resection site.  Her past medical history includes thyroiditis, hypertension, depression, obstructive sleep apnea managed with CPAP, and a history of melanoma removed from her skin in 2020. She has also had basal cell carcinoma and one squamous cell carcinoma removed.  Family history is notable for her natural father having died from non-small cell lung cancer, and her maternal grandmother had COPD. She denies any history of tuberculosis exposure or positive TB tests.  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 She has never smoked and denies significant exposure to fumes,  chemicals, or dusts.   RADIOLOGY CT chest: Large right hilar mass situated centrally in the right middle lobe involving the lower lobe measuring 4.5 x 3.5 x 3.9 cm. No spiculation or calcification. No evidence of endobronchial lesion, lobar collapse, or other nodules. (08/03/2023)  CT abdomen and pelvis: Incompletely imaged right hilar mass (07/27/2023)  Review of Systems As per HPI  Past Medical History:  Diagnosis Date   Anxiety state, unspecified    HISTORY OF Acute thyroiditis 06/16/2002   PROBLEM WITH THYROIDITIS RESOLVED   History of COVID-19 02/2021   FATIGUE/SINUS ISSUES X 7 DAYS ALL ISSUES RESOLVED TOOOK ORAL PO TREATMENT   History of COVID-19 03/05/2022   fatigue and sinus drainage all symptoms resolved, did home test, and dr office pcr test   History of Essential hypertension    no medications in 2 to 3 years per pt as of 03-19-2022   history of Hypercholesteremia    History of kidney stones    X 1- 25 YRS AGO PASSED ON OWN PER PT ON 08-05-2021   Insomnia, unspecified    Major depression    Pt continues to struggle - currently more anxiety than depression. Increasing Fluoxetine may aggrevate the RLS. She will increase 1 mg at a time.   melanoma Cancer (HCC)    SKIN CANCERS melanoma removed x 3  2020   PONV (postoperative nausea and vomiting)    SEVERE!!   PT REQUESTS SCOPOLAMINE PATCH FOR ALL SURGERIES!   Sleep apnea    uses cpap pt does not know settings   Umbilical hernia    Had repair surgery but was complicated  with infection so pt. had a second repair surgery   Wears glasses OR CONTACTS      Family History  Problem Relation Age of Onset   Heart failure Mother    Heart disease Mother    CAD Mother 47   Osteoarthritis Mother    High Cholesterol Mother    Cancer Father        Non-small cell lung cancer   CAD Maternal Grandmother    Hypertension Maternal Grandmother    CAD Maternal Grandfather     - Grandmother (mother's side) had COPD  Social History    Socioeconomic History   Marital status: Married    Spouse name: Not on file   Number of children: Not on file   Years of education: Not on file   Highest education level: Not on file  Occupational History   Not on file  Tobacco Use   Smoking status: Never   Smokeless tobacco: Never  Vaping Use   Vaping status: Never Used  Substance and Sexual Activity   Alcohol use: No   Drug use: No   Sexual activity: Yes  Other Topics Concern   Not on file  Social History Narrative   Not on file   Social Drivers of Health   Financial Resource Strain: Not on file  Food Insecurity: Not on file  Transportation Needs: Not on file  Physical Activity: Not on file  Stress: Not on file  Social Connections: Not on file  Intimate Partner Violence: Not on file     Allergies  Allergen Reactions   Penicillins Anaphylaxis and Other (See Comments)    Throat swelling, ears red   Erythromycin Hives    Severe abdominal cramping   Codeine Hives and Other (See Comments)    Hallucinations, stomach cramps   Lisinopril     Dry cough     Outpatient Medications Prior to Visit  Medication Sig Dispense Refill   acetaminophen (TYLENOL) 325 MG tablet Take 650 mg by mouth every 6 (six) hours as needed.     busPIRone (BUSPAR) 10 MG tablet Take 15 mg by mouth 3 (three) times daily. 1 in am  and  200 pm     estradiol (CLIMARA - DOSED IN MG/24 HR) 0.05 mg/24hr patch Place 0.05 mg onto the skin once a week. Change on monday     traZODone (DESYREL) 50 MG tablet Take 50 mg by mouth at bedtime.     vortioxetine HBr (TRINTELLIX) 20 MG TABS tablet Take 20 mg by mouth daily.     ibuprofen (ADVIL) 200 MG tablet Take 600 mg by mouth every 6 (six) hours as needed. (Patient not taking: Reported on 08/06/2023)     lurasidone (LATUDA) 20 MG TABS tablet Take 40 mg by mouth at bedtime. (Patient not taking: Reported on 08/06/2023)     nystatin (MYCOSTATIN/NYSTOP) powder Apply 1 application topically 3 (three) times daily.  Apply to folds under abdomen and other affected areas. (Patient not taking: Reported on 08/06/2023) 60 g 5   progesterone (PROMETRIUM) 100 MG capsule Take 200 mg by mouth at bedtime. (Patient not taking: Reported on 08/06/2023)     solifenacin (VESICARE) 10 MG tablet Take 1 tablet (10 mg total) by mouth daily. (Patient not taking: Reported on 08/06/2023) 30 tablet 5   No facility-administered medications prior to visit.        Objective:   Physical Exam  Vitals:   08/06/23 1403 08/06/23 1405  BP: (!) 148/70 (!) 144/84  Pulse:  90   SpO2: 99%   Weight: 241 lb 6.4 oz (109.5 kg)   Height: 5\' 1"  (1.549 m)     Gen: Pleasant, obese woman, in no distress,  normal affect  ENT: Well-healed linear scar along the mandible,  mouth clear,  oropharynx clear, no postnasal drip,  Neck: No JVD, no stridor  Lungs: No use of accessory muscles, no crackles or wheezing on normal respiration, no wheeze on forced expiration  Cardiovascular: RRR, heart sounds normal, no murmur or gallops, no peripheral edema  Musculoskeletal: No deformities, no cyanosis or clubbing  Neuro: alert, awake, non focal  Skin: Warm, no lesions or rash      Assessment & Plan:  Hilar mass Right Hilar Mass Large right hilar mass situated centrally in the right middle lobe but also involving the lower lobe measuring 4.5 by 3.5 by 3.9 cm. No spiculation or calcification. No evidence of endobronchial lesion, lobar collapse or other nodules. Differential diagnosis includes benign tumor, infection, or malignancy. Given the patient's history of melanoma, there is a concern for metastasis. -Perform bronchoscopy with biopsy to establish a tissue diagnosis. -Plan for follow-up in 2-3 days to discuss biopsy results.   Melanoma (HCC) History of Melanoma Melanoma removed in 2020 with no additional treatment required. No current evidence of recurrence or metastasis. -Continue regular skin checks and monitoring for  recurrence.  Anxiety Anxiety Patient reports severe anxiety, particularly related to medical results. -Provide clear communication and reassurance during diagnostic process.   Levy Pupa, MD, PhD 08/06/2023, 4:37 PM [facility] [phone] or if no answer before 7:00PM call [phone] For any issues after 7:00PM please call eLink [phone]

## 2023-08-06 NOTE — Assessment & Plan Note (Signed)
History of Melanoma Melanoma removed in 2020 with no additional treatment required. No current evidence of recurrence or metastasis. -Continue regular skin checks and monitoring for recurrence.

## 2023-08-06 NOTE — Patient Instructions (Signed)
We reviewed your CT scan of the chest today. We will arrange for robotic assisted navigational bronchoscopy to evaluate a mass in the right lung.  This will be done as an outpatient under general anesthesia at [facility] endoscopy.  You will need a designated driver and someone to watch you that day after the procedure.  We will try to get this scheduled for 08/17/2023 Please follow in our office the week after the procedure so we can review results

## 2023-08-13 ENCOUNTER — Other Ambulatory Visit: Payer: Self-pay

## 2023-08-13 ENCOUNTER — Encounter (HOSPITAL_COMMUNITY): Payer: Self-pay | Admitting: Emergency Medicine

## 2023-08-13 NOTE — Progress Notes (Signed)
 SDW call  Patient was given pre-op instructions over the phone. Patient verbalized understanding of instructions provided.     PCP - Dr. Laurann Montana Cardiologist -  Pulmonary:    PPM/ICD - denies Device Orders - na Rep Notified - na   Chest x-ray - na EKG -  DOS, 3/3/20025 Stress Test - ECHO -  Cardiac Cath -   Sleep Study/sleep apnea/CPAP: dx with sleep apnea, does not use her CPAP  Non-diabetic  Blood Thinner Instructions: denies Aspirin Instructions:denies   ERAS Protcol - NPO   Anesthesia review: Yes. HTN, OSA   Patient denies shortness of breath, fever, cough and chest pain over the phone call  Your procedure is scheduled on Monday August 17, 2023  Report to [facility] Main Entrance "A" at  0530  A.M., then check in with the Admitting office.  Call this number if you have problems the morning of surgery:  [phone]   If you have any questions prior to your surgery date call [phone]: Open Monday-Friday 8am-4pm If you experience any cold or flu symptoms such as cough, fever, chills, shortness of breath, etc. between now and your scheduled surgery, please notify us at the above number    Remember:  Do not eat or drink after midnight the night before your surgery  Take these medicines the morning of surgery with A SIP OF WATER:  Buspar, trintellix, norethindrone  As needed: tylenol  As of today, STOP taking any Aspirin (unless otherwise instructed by your surgeon) Aleve, Naproxen, Ibuprofen, Motrin, Advil, Goody's, BC's, all herbal medications, fish oil, and all vitamins.

## 2023-08-14 ENCOUNTER — Institutional Professional Consult (permissible substitution): Payer: 59 | Admitting: Emergency Medicine

## 2023-08-17 ENCOUNTER — Encounter (HOSPITAL_COMMUNITY): Payer: Self-pay | Admitting: Emergency Medicine

## 2023-08-17 ENCOUNTER — Ambulatory Visit (HOSPITAL_COMMUNITY)

## 2023-08-17 ENCOUNTER — Encounter (HOSPITAL_COMMUNITY)
Admission: RE | Disposition: A | Discharge status: Home / Self Care | Payer: Self-pay | Source: Home / Self Care | Attending: Emergency Medicine

## 2023-08-17 ENCOUNTER — Ambulatory Visit (HOSPITAL_COMMUNITY)
Admission: RE | Admit: 2023-08-17 | Discharge: 2023-08-17 | Disposition: A | Discharge status: Home / Self Care | Payer: 59 | Attending: Emergency Medicine | Admitting: Emergency Medicine

## 2023-08-17 ENCOUNTER — Ambulatory Visit (HOSPITAL_COMMUNITY): Admitting: Certified Registered Nurse Anesthetist

## 2023-08-17 ENCOUNTER — Telehealth: Payer: Self-pay | Admitting: Emergency Medicine

## 2023-08-17 ENCOUNTER — Ambulatory Visit (HOSPITAL_BASED_OUTPATIENT_CLINIC_OR_DEPARTMENT_OTHER): Admitting: Certified Registered Nurse Anesthetist

## 2023-08-17 DIAGNOSIS — Z836 Family history of other diseases of the respiratory system: Secondary | ICD-10-CM | POA: Insufficient documentation

## 2023-08-17 DIAGNOSIS — C7801 Secondary malignant neoplasm of right lung: Secondary | ICD-10-CM | POA: Diagnosis present

## 2023-08-17 DIAGNOSIS — E069 Thyroiditis, unspecified: Secondary | ICD-10-CM | POA: Insufficient documentation

## 2023-08-17 DIAGNOSIS — Z801 Family history of malignant neoplasm of trachea, bronchus and lung: Secondary | ICD-10-CM | POA: Insufficient documentation

## 2023-08-17 DIAGNOSIS — F419 Anxiety disorder, unspecified: Secondary | ICD-10-CM | POA: Insufficient documentation

## 2023-08-17 DIAGNOSIS — R918 Other nonspecific abnormal finding of lung field: Secondary | ICD-10-CM | POA: Diagnosis not present

## 2023-08-17 DIAGNOSIS — C3411 Malignant neoplasm of upper lobe, right bronchus or lung: Secondary | ICD-10-CM | POA: Diagnosis not present

## 2023-08-17 DIAGNOSIS — E66813 Obesity, class 3: Secondary | ICD-10-CM | POA: Diagnosis not present

## 2023-08-17 DIAGNOSIS — Z85820 Personal history of malignant melanoma of skin: Secondary | ICD-10-CM | POA: Insufficient documentation

## 2023-08-17 DIAGNOSIS — Z6841 Body Mass Index (BMI) 40.0 and over, adult: Secondary | ICD-10-CM | POA: Insufficient documentation

## 2023-08-17 DIAGNOSIS — F32A Depression, unspecified: Secondary | ICD-10-CM | POA: Diagnosis not present

## 2023-08-17 DIAGNOSIS — G4733 Obstructive sleep apnea (adult) (pediatric): Secondary | ICD-10-CM

## 2023-08-17 DIAGNOSIS — I10 Essential (primary) hypertension: Secondary | ICD-10-CM

## 2023-08-17 DIAGNOSIS — Z79899 Other long term (current) drug therapy: Secondary | ICD-10-CM | POA: Diagnosis not present

## 2023-08-17 DIAGNOSIS — Z8249 Family history of ischemic heart disease and other diseases of the circulatory system: Secondary | ICD-10-CM | POA: Diagnosis not present

## 2023-08-17 DIAGNOSIS — Z85828 Personal history of other malignant neoplasm of skin: Secondary | ICD-10-CM | POA: Diagnosis not present

## 2023-08-17 HISTORY — PX: BRONCHIAL NEEDLE ASPIRATION BIOPSY: SHX5106

## 2023-08-17 HISTORY — PX: BRONCHIAL BIOPSY: SHX5109

## 2023-08-17 LAB — POCT PREGNANCY, URINE: Preg Test, Ur: NEGATIVE

## 2023-08-17 SURGERY — BRONCHOSCOPY, WITH BIOPSY USING ELECTROMAGNETIC NAVIGATION
Anesthesia: General | Laterality: Right

## 2023-08-17 MED ORDER — FENTANYL CITRATE (PF) 100 MCG/2ML IJ SOLN: 25.0000 ug | INTRAMUSCULAR | Status: DC | PRN

## 2023-08-17 MED ORDER — CHLORHEXIDINE GLUCONATE 0.12 % MT SOLN
OROMUCOSAL | Status: AC
  Administered 2023-08-17: 15 mL via OROMUCOSAL
  Filled 2023-08-17: qty 15

## 2023-08-17 MED ORDER — SUGAMMADEX SODIUM 200 MG/2ML IV SOLN
INTRAVENOUS | Status: DC | PRN
  Administered 2023-08-17: 217.8 mg via INTRAVENOUS

## 2023-08-17 MED ORDER — SCOPOLAMINE 1 MG/3DAYS TD PT72
MEDICATED_PATCH | TRANSDERMAL | Status: AC
  Administered 2023-08-17: 1.5 mg via TRANSDERMAL
  Filled 2023-08-17: qty 1

## 2023-08-17 MED ORDER — PHENYLEPHRINE HCL-NACL 20-0.9 MG/250ML-% IV SOLN
INTRAVENOUS | Status: DC | PRN
  Administered 2023-08-17: 30 ug/min via INTRAVENOUS

## 2023-08-17 MED ORDER — SCOPOLAMINE 1 MG/3DAYS TD PT72: 1.0000 | MEDICATED_PATCH | TRANSDERMAL | Status: DC

## 2023-08-17 MED ORDER — LACTATED RINGERS IV SOLN: INTRAVENOUS | Status: DC | PRN

## 2023-08-17 MED ORDER — PROPOFOL 10 MG/ML IV BOLUS
INTRAVENOUS | Status: DC | PRN
  Administered 2023-08-17: 150 mg via INTRAVENOUS
  Administered 2023-08-17: 150 ug/kg/min via INTRAVENOUS

## 2023-08-17 MED ORDER — OXYCODONE HCL 5 MG PO TABS: 5.0000 mg | ORAL_TABLET | Freq: Once | ORAL | Status: DC | PRN

## 2023-08-17 MED ORDER — ONDANSETRON HCL 4 MG/2ML IJ SOLN: 4.0000 mg | Freq: Four times a day (QID) | INTRAMUSCULAR | Status: DC | PRN

## 2023-08-17 MED ORDER — MIDAZOLAM HCL 5 MG/5ML IJ SOLN
INTRAMUSCULAR | Status: DC | PRN
  Administered 2023-08-17: 1 mg via INTRAVENOUS

## 2023-08-17 MED ORDER — MIDAZOLAM HCL 2 MG/2ML IJ SOLN
INTRAMUSCULAR | Status: AC
  Filled 2023-08-17: qty 2

## 2023-08-17 MED ORDER — ONDANSETRON HCL 4 MG/2ML IJ SOLN
INTRAMUSCULAR | Status: DC | PRN
  Administered 2023-08-17: 4 mg via INTRAVENOUS

## 2023-08-17 MED ORDER — LIDOCAINE 2% (20 MG/ML) 5 ML SYRINGE
INTRAMUSCULAR | Status: DC | PRN
  Administered 2023-08-17: 60 mg via INTRAVENOUS

## 2023-08-17 MED ORDER — ROCURONIUM BROMIDE 10 MG/ML (PF) SYRINGE
PREFILLED_SYRINGE | INTRAVENOUS | Status: DC | PRN
Start: 2023-08-17 — End: 2023-08-17
  Administered 2023-08-17: 50 mg via INTRAVENOUS

## 2023-08-17 MED ORDER — OXYCODONE HCL 5 MG/5ML PO SOLN: 5.0000 mg | Freq: Once | ORAL | Status: DC | PRN

## 2023-08-17 MED ORDER — CHLORHEXIDINE GLUCONATE 0.12 % MT SOLN: 15.0000 mL | OROMUCOSAL | Status: AC

## 2023-08-17 NOTE — Transfer of Care (Signed)
 Immediate Anesthesia Transfer of Care Note  Patient: Amanda Baker  Procedure(s) Performed: ROBOTIC ASSISTED NAVIGATIONAL BRONCHOSCOPY (Right) BRONCHIAL NEEDLE ASPIRATION BIOPSIES BRONCHIAL BIOPSIES  Patient Location: PACU  Anesthesia Type:General  Level of Consciousness: drowsy and patient cooperative  Airway & Oxygen Therapy: Patient Spontanous Breathing and Patient connected to face mask oxygen  Post-op Assessment: Report given to RN and Post -op Vital signs reviewed and stable  Post vital signs: Reviewed and stable, see PACU post op VS flowsheet  Last Vitals:  Vitals Value Taken Time  BP    Temp    Pulse 92 08/17/23 0846  Resp 14 08/17/23 0846  SpO2 100 % 08/17/23 0846  Vitals shown include unfiled device data.  Last Pain:  Vitals:   08/17/23 0615  PainSc: 0-No pain      Patients Stated Pain Goal: 0 (08/17/23 0615)  Complications:  Encounter Notable Events  Notable Event Outcome Phase Comment  Difficult to intubate - expected  Intraprocedure Filed from anesthesia note documentation.

## 2023-08-17 NOTE — Discharge Instructions (Signed)
 Flexible Bronchoscopy, Care After This sheet gives you information about how to care for yourself after your test. Your doctor may also give you more specific instructions. If you have problems or questions, contact your doctor. Follow these instructions at home: Eating and drinking When your numbness is gone and your cough and gag reflexes have come back, you may: Eat only soft foods. Slowly drink liquids. Will get home after the test, go back to your normal diet. Driving Do not drive for 24 hours if you were given a medicine to help you relax (sedative). Do not drive or use heavy machinery while taking prescription pain medicine. General instructions  Take over-the-counter and prescription medicines only as told by your doctor. Return to your normal activities as told. Ask what activities are safe for you. Do not use any products that have nicotine or tobacco in them. This includes cigarettes and e-cigarettes. If you need help quitting, ask your doctor. Keep all follow-up visits as told by your doctor. This is important. It is very important if you had a tissue sample (biopsy) taken. Get help right away if: You have shortness of breath that gets worse. You get light-headed. You feel like you are going to pass out (faint). You have chest pain. You cough up: More than a little blood. More blood than before. Summary Do not eat or drink anything (not even water) for 2 hours after your test, or until your numbing medicine wears off. Do not use cigarettes. Do not use e-cigarettes. Get help right away if you have chest pain.  Please call our office for any questions or concerns.  [phone].  This information is not intended to replace advice given to you by your health care provider. Make sure you discuss any questions you have with your health care provider. Document Released: 03/30/2009 Document Revised: 05/15/2017 Document Reviewed: 06/20/2016 Elsevier Patient Education  2020  [employer].

## 2023-08-17 NOTE — Anesthesia Postprocedure Evaluation (Signed)
 Anesthesia Post Note  Patient: TIERSA DAYLEY  Procedure(s) Performed: ROBOTIC ASSISTED NAVIGATIONAL BRONCHOSCOPY (Right) BRONCHIAL NEEDLE ASPIRATION BIOPSIES BRONCHIAL BIOPSIES     Patient location during evaluation: PACU Anesthesia Type: General Level of consciousness: awake and alert Pain management: pain level controlled Vital Signs Assessment: post-procedure vital signs reviewed and stable Respiratory status: spontaneous breathing, nonlabored ventilation, respiratory function stable and patient connected to nasal cannula oxygen Cardiovascular status: blood pressure returned to baseline and stable Postop Assessment: no apparent nausea or vomiting Anesthetic complications: yes   Encounter Notable Events  Notable Event Outcome Phase Comment  Difficult to intubate - expected  Intraprocedure Filed from anesthesia note documentation.    Last Vitals:  Vitals:   08/17/23 0900 08/17/23 0930  BP: 121/62 (!) 141/80  Pulse: 92 88  Resp: 14 16  Temp:  36.9 C  SpO2: 96% 96%    Last Pain:  Vitals:   08/17/23 0930  PainSc: 0-No pain                 Novie Maggio S

## 2023-08-17 NOTE — Interval H&P Note (Signed)
 History and Physical Interval Note:  08/17/2023 7:23 AM  Amanda Baker  has presented today for surgery, with the diagnosis of RIGHT HILAR MASS.  The various methods of treatment have been discussed with the patient and family. After consideration of risks, benefits and other options for treatment, the patient has consented to  Procedure(s): ROBOTIC ASSISTED NAVIGATIONAL BRONCHOSCOPY (Right) as a surgical intervention.  The patient's history has been reviewed, patient examined, no change in status, stable for surgery.  I have reviewed the patient's chart and labs.  Questions were answered to the patient's satisfaction.     Leslye Peer

## 2023-08-17 NOTE — Telephone Encounter (Signed)
 Patient's app w Amanda Baker needs to be moved up to next week please. Thank you

## 2023-08-17 NOTE — Anesthesia Procedure Notes (Signed)
 Procedure Name: Intubation Date/Time: 08/17/2023 7:57 AM  Performed by: Cy Blamer, CRNAPre-anesthesia Checklist: Patient identified, Emergency Drugs available, Suction available and Patient being monitored Patient Re-evaluated:Patient Re-evaluated prior to induction Oxygen Delivery Method: Circle system utilized Preoxygenation: Pre-oxygenation with 100% oxygen Induction Type: IV induction Ventilation: Mask ventilation without difficulty Laryngoscope Size: Glidescope and 3 Grade View: Grade I Tube type: Oral Tube size: 8.5 mm Number of attempts: 2 Airway Equipment and Method: Stylet, Video-laryngoscopy and Bite block Placement Confirmation: ETT inserted through vocal cords under direct vision, positive ETCO2 and breath sounds checked- equal and bilateral Secured at: 20 cm Tube secured with: Tape Dental Injury: Teeth and Oropharynx as per pre-operative assessment  Difficulty Due To: Difficulty was anticipated, Difficult Airway- due to anterior larynx, Difficult Airway- due to limited oral opening and Difficult Airway- due to dentition Comments: 1st attempt Grade III view w/Miller 2

## 2023-08-17 NOTE — Op Note (Signed)
 Video Bronchoscopy with Robotic Assisted Bronchoscopic Navigation   Date of Operation: 08/17/2023   Pre-op Diagnosis: Right hilar mass  Post-op Diagnosis: Same  Surgeon: Levy Pupa  Assistants: None  Anesthesia: General endotracheal anesthesia  Operation: Flexible video fiberoptic bronchoscopy with robotic assistance and biopsies.  Estimated Blood Loss: Minimal  Complications: None  Indications and History: Amanda Baker is a 53 y.o. female never smoker with history of melanoma, OSA/OHS, thyroiditis, depression/anxiety.  Found to have a right hilar mass on CT abdomen and then confirmed on CT chest.  Recommendation made to achieve a tissue diagnosis via robotic assisted navigational bronchoscopy.  The risks, benefits, complications, treatment options and expected outcomes were discussed with the patient.  The possibilities of pneumothorax, pneumonia, reaction to medication, pulmonary aspiration, perforation of a viscus, bleeding, failure to diagnose a condition and creating a complication requiring transfusion or operation were discussed with the patient who freely signed the consent.    Description of Procedure: The patient was seen in the Preoperative Area, was examined and was deemed appropriate to proceed.  The patient was taken to [facility] endoscopy room 3, identified as KAEGAN STIGLER and the procedure verified as Flexible Video Fiberoptic Bronchoscopy.  A Time Out was held and the above information confirmed.   Prior to the date of the procedure a high-resolution CT scan of the chest was performed. Utilizing ION software program a virtual tracheobronchial tree was generated to allow the creation of distinct navigation pathways to the patient's parenchymal abnormalities. After being taken to the operating room general anesthesia was initiated and the patient  was orally intubated. The video fiberoptic bronchoscope was introduced via the endotracheal tube and a general inspection was  performed which showed normal right and left lung anatomy.  The bronchial wall mucosa was somewhat irritable but was normal on inspection.  There was some mild bleeding due to suction trauma. Aspiration of the bilateral mainstems was completed to remove any remaining secretions. Robotic catheter inserted into patient's endotracheal tube.   Target #1 right hilar mass: The distinct navigation pathways prepared prior to this procedure were then utilized to navigate to patient's lesion identified on CT scan. The robotic catheter was secured into place and the vision probe was withdrawn.  Lesion location was approximated using fluoroscopy. Under fluoroscopic guidance transbronchial needle biopsies, and transbronchial forceps biopsies were performed to be sent for cytology and pathology.  Needle in lesion was confirmed using Cios three-dimensional imaging. .  At the end of the procedure a general airway inspection was performed and there was no evidence of active bleeding. The bronchoscope was removed.  The patient tolerated the procedure well. There was no significant blood loss and there were no obvious complications. A post-procedural chest x-ray is pending.  Samples Target #1: 1. Transbronchial Wang needle biopsies from right hilar mass 2. Transbronchial forceps biopsies from right hilar mass   Plans:  The patient will be discharged from the PACU to home when recovered from anesthesia and after chest x-ray is reviewed. We will review the cytology, pathology and microbiology results with the patient when they become available. Outpatient followup will be with Saralyn Pilar, NP and Dr. Delton Coombes.   Levy Pupa, MD, PhD 08/17/2023, 8:37 AM [facility] [phone] or if no answer before 7:00PM call [phone] For any issues after 7:00PM please call eLink [phone]

## 2023-08-17 NOTE — Anesthesia Preprocedure Evaluation (Signed)
 Anesthesia Evaluation  Patient identified by MRN, date of birth, ID band Patient awake    Reviewed: Allergy & Precautions, H&P , NPO status , Patient's Chart, lab work & pertinent test results  History of Anesthesia Complications (+) PONV and history of anesthetic complications  Airway Mallampati: II   Neck ROM: full    Dental   Pulmonary sleep apnea    breath sounds clear to auscultation       Cardiovascular hypertension,  Rhythm:regular Rate:Normal     Neuro/Psych  PSYCHIATRIC DISORDERS Anxiety Depression       GI/Hepatic   Endo/Other    Class 3 obesity  Renal/GU      Musculoskeletal   Abdominal   Peds  Hematology   Anesthesia Other Findings   Reproductive/Obstetrics                             Anesthesia Physical Anesthesia Plan  ASA: 2  Anesthesia Plan: General   Post-op Pain Management:    Induction: Intravenous  PONV Risk Score and Plan: 4 or greater and Ondansetron, Propofol infusion, Scopolamine patch - Pre-op and Treatment may vary due to age or medical condition  Airway Management Planned: Oral ETT  Additional Equipment:   Intra-op Plan:   Post-operative Plan: Extubation in OR  Informed Consent: I have reviewed the patients History and Physical, chart, labs and discussed the procedure including the risks, benefits and alternatives for the proposed anesthesia with the patient or authorized representative who has indicated his/her understanding and acceptance.     Dental advisory given  Plan Discussed with: CRNA, Anesthesiologist and Surgeon  Anesthesia Plan Comments:        Anesthesia Quick Evaluation

## 2023-08-19 ENCOUNTER — Encounter (HOSPITAL_COMMUNITY): Payer: Self-pay | Admitting: Emergency Medicine

## 2023-08-19 LAB — CYTOLOGY - NON PAP

## 2023-08-26 ENCOUNTER — Encounter: Payer: Self-pay | Admitting: Emergency Medicine

## 2023-08-26 ENCOUNTER — Ambulatory Visit: Admitting: Emergency Medicine

## 2023-08-26 VITALS — BP 130/74 | HR 96 | Ht 61.0 in | Wt 243.2 lb

## 2023-08-26 DIAGNOSIS — C4330 Malignant melanoma of unspecified part of face: Secondary | ICD-10-CM | POA: Diagnosis not present

## 2023-08-26 DIAGNOSIS — R918 Other nonspecific abnormal finding of lung field: Secondary | ICD-10-CM

## 2023-08-26 NOTE — Patient Instructions (Signed)
 We discussed your bronchoscopy results today. We will review these with Dr. Charlton Haws with dermatology and make plans to make the appropriate oncology referrals.

## 2023-08-26 NOTE — Assessment & Plan Note (Signed)
 Her navigational bronchoscopy and biopsies confirmed metastatic melanoma in her right hilar mass.  She will need oncology referral, question whether she may be a surgical candidate depending on PET scan.  We will order the PET scan today locally.  I spoke with her dermatologist Dr. Charlton Haws and she has recommended referral to [facility] oncology to initiate care.  I made that referral today.  Discussed these results and the plans with Amanda Baker and her husband in detail.

## 2023-08-26 NOTE — Progress Notes (Signed)
 Subjective:    Patient ID: Amanda Baker, female    DOB: 1970-07-28, 53 y.o.   MRN: [id number]  HPI  ROV 08/26/2023 --Amanda Baker is 72, very pleasant woman with history of severe anxiety depression, hypertension, hyperlipidemia, OSA on CPAP.  She had melanoma removed from her skin of her chin in 2020.  I saw her 08/06/2023 for a newly identified right middle lobe mass.  She underwent bronchoscopy 08/17/2023 and is here to discuss pathology. Her right upper lobe needle aspiration is consistent with metastatic melanoma. She is feeling well post-bronchoscopy. Breathing ok. Her Dermatologist is Amanda Baker [phone] >> Reviewed the results with Dr Amanda Baker   Review of Systems As per HPI  Past Medical History:  Diagnosis Date   Anxiety state, unspecified    HISTORY OF Acute thyroiditis 06/16/2002   PROBLEM WITH THYROIDITIS RESOLVED   History of COVID-19 02/2021   FATIGUE/SINUS ISSUES X 7 DAYS ALL ISSUES RESOLVED TOOOK ORAL PO TREATMENT   History of COVID-19 03/05/2022   fatigue and sinus drainage all symptoms resolved, did home test, and dr office pcr test   History of Essential hypertension    no medications in 2 to 3 years per pt as of 03-19-2022   history of Hypercholesteremia    History of kidney stones    X 1- 25 YRS AGO PASSED ON OWN PER PT ON 20-Jul-2021   Insomnia, unspecified    Major depression    Pt continues to struggle - currently more anxiety than depression. Increasing Fluoxetine may aggrevate the RLS. She will increase 1 mg at a time.   melanoma Cancer (HCC)    SKIN CANCERS melanoma removed x 3  2020   PONV (postoperative nausea and vomiting)    SEVERE!!   PT REQUESTS SCOPOLAMINE PATCH FOR ALL SURGERIES!   Sleep apnea    does not use CPAP   Umbilical hernia    Had repair surgery but was complicated with infection so pt. had a second repair surgery   Wears glasses OR CONTACTS      Family History  Problem Relation Age of Onset   Heart failure Mother    Heart  disease Mother    CAD Mother 33   Osteoarthritis Mother    High Cholesterol Mother    Cancer Father        Non-small cell lung cancer   CAD Maternal Grandmother    Hypertension Maternal Grandmother    CAD Maternal Grandfather     - Grandmother (mother's side) had COPD  Social History   Socioeconomic History   Marital status: Married    Spouse name: Not on file   Number of children: Not on file   Years of education: Not on file   Highest education level: Not on file  Occupational History   Not on file  Tobacco Use   Smoking status: Never   Smokeless tobacco: Never  Vaping Use   Vaping status: Never Used  Substance and Sexual Activity   Alcohol use: No   Drug use: No   Sexual activity: Yes  Other Topics Concern   Not on file  Social History Narrative   Not on file   Social Drivers of Health   Financial Resource Strain: Not on file  Food Insecurity: Not on file  Transportation Needs: Not on file  Physical Activity: Not on file  Stress: Not on file  Social Connections: Not on file  Intimate Partner Violence: Not on file     Allergies  Allergen Reactions   Penicillins Anaphylaxis and Other (See Comments)    Throat swelling, ears red   Erythromycin Hives    Severe abdominal cramping   Codeine Hives and Other (See Comments)    Hallucinations, stomach cramps   Lisinopril     Dry cough   Bactrim [Sulfamethoxazole-Trimethoprim] Nausea And Vomiting     Outpatient Medications Prior to Visit  Medication Sig Dispense Refill   acetaminophen (TYLENOL) 325 MG tablet Take 650 mg by mouth every 6 (six) hours as needed.     busPIRone (BUSPAR) 10 MG tablet Take 15 mg by mouth 3 (three) times daily. 1 in am  and  200 pm     estradiol-norethindrone (ACTIVELLA) 1-0.5 MG tablet Take 1 tablet by mouth daily.     ibuprofen (ADVIL) 200 MG tablet Take 600 mg by mouth every 6 (six) hours as needed for mild pain (pain score 1-3).     traZODone (DESYREL) 50 MG tablet Take 100 mg by  mouth at bedtime.     vortioxetine HBr (TRINTELLIX) 20 MG TABS tablet Take 20 mg by mouth daily.     nystatin (MYCOSTATIN/NYSTOP) powder Apply 1 application topically 3 (three) times daily. Apply to folds under abdomen and other affected areas. (Patient not taking: Reported on 08/26/2023) 60 g 5   solifenacin (VESICARE) 10 MG tablet Take 1 tablet (10 mg total) by mouth daily. (Patient not taking: Reported on 08/26/2023) 30 tablet 5   No facility-administered medications prior to visit.        Objective:   Physical Exam  Vitals:   08/26/23 1505  BP: 130/74  Pulse: 96  SpO2: 100%  Weight: 243 lb 3.2 oz (110.3 kg)  Height: 5\' 1"  (1.549 m)    Gen: Pleasant, obese woman, in no distress,  normal affect  ENT: Well-healed linear scar along the mandible,  mouth clear,  oropharynx clear, no postnasal drip,  Neck: No JVD, no stridor  Lungs: No use of accessory muscles, no crackles or wheezing on normal respiration, no wheeze on forced expiration  Cardiovascular: RRR, heart sounds normal, no murmur or gallops, no peripheral edema  Musculoskeletal: No deformities, no cyanosis or clubbing  Neuro: alert, awake, non focal  Skin: Warm, no lesions or rash      Assessment & Plan:  Hilar mass Her navigational bronchoscopy and biopsies confirmed metastatic melanoma in her right hilar mass.  She will need oncology referral, question whether she may be a surgical candidate depending on PET scan.  We will order the PET scan today locally.  I spoke with her dermatologist Dr. Charlton Baker and she has recommended referral to [facility] oncology to initiate care.  I made that referral today.  Discussed these results and the plans with Amanda Baker and her husband in detail.  Time spent 42 minutes  Levy Pupa, MD, PhD 08/26/2023, 5:06 PM [facility] [phone] or if no answer before 7:00PM call [phone] For any issues after 7:00PM please call eLink [phone]

## 2023-08-28 ENCOUNTER — Encounter (HOSPITAL_COMMUNITY)
Admission: RE | Admit: 2023-08-28 | Discharge: 2023-08-28 | Disposition: A | Discharge status: Home / Self Care | Source: Ambulatory Visit | Attending: Emergency Medicine | Admitting: Emergency Medicine

## 2023-08-28 DIAGNOSIS — C4330 Malignant melanoma of unspecified part of face: Secondary | ICD-10-CM | POA: Insufficient documentation

## 2023-08-28 LAB — GLUCOSE, CAPILLARY: Glucose-Capillary: 93 mg/dL (ref 70–99)

## 2023-08-28 MED ORDER — FLUDEOXYGLUCOSE F - 18 (FDG) INJECTION
12.1500 | Freq: Once | INTRAVENOUS | Status: AC
  Administered 2023-08-28: 12.11 via INTRAVENOUS

## 2023-08-31 ENCOUNTER — Telehealth: Payer: Self-pay

## 2023-08-31 NOTE — Telephone Encounter (Signed)
 Spoke w/ Aram Beecham. She completed PET scan & would like to know results asap. She has not been contacted by [facility] yet for referral, [facility] is reaching out to see what is the hold up. Please advise Dr. Delton Coombes & [facility].

## 2023-08-31 NOTE — Telephone Encounter (Signed)
 I contacted [facility], and they confirmed that they received the referral for the patient on the 13th. I was informed that they don't prioritize urgent cases; instead, they process referrals as they arrive. They are just now reviewing the referral and need to confirm with the practice's doctor whether they will accept this patient. The patient should expect a call to schedule an appointment between today and Wednesday. I also provided my direct number in case there are any issues with scheduling. I'll give the pt a call to let them know.

## 2023-08-31 NOTE — Telephone Encounter (Signed)
 I reviewed the PET scan results with the patient.  The right hilar mass is hypermetabolic as expected.  There is no mediastinal or hilar adenopathy.  She hears some activity in the umbilical region where she had previous surgery performed..  There is also a 1 cm nodular focus in the left pelvic area of unclear significance, question node that is hypermetabolic.  She may need to have this biopsied depending on the recommendations of oncology.  She is going to wait to hear from [facility] to see when she can be seen.  If there is a significant delay then we can also consider referring her in [place] instead.

## 2023-08-31 NOTE — Telephone Encounter (Signed)
 Spoke with Amanda Baker. She wanted to inform Dr. Delton Coombes that [facility] reached out to her and they have her an appt scheduled for 3/28 @10am , she would like to know if she needs to be seen somewhere in [place] or if she should wait it out. They did put her on the waiting list for sooner avail appts. Please advise Dr. Delton Coombes

## 2023-09-01 NOTE — Telephone Encounter (Signed)
 Talked w/ Aubryana, pt is going to keep planned appt unless she gets in sooner. Pt verbalized understanding to Dr. Neville Route note. Nothing further needed.

## 2023-09-01 NOTE — Telephone Encounter (Signed)
 I think she should go to [facility] - that timing is about as quick as we would be able to do locally.  It may be wise for her to gather any important info that [facility] will want to see to have available for them >> Dermatology Notes, our notes, radiology and path reports, etc

## 2023-09-08 ENCOUNTER — Other Ambulatory Visit (HOSPITAL_COMMUNITY): Payer: Self-pay | Admitting: Medical Oncology

## 2023-09-08 ENCOUNTER — Ambulatory Visit: Payer: 59 | Admitting: Acute Care

## 2023-09-08 DIAGNOSIS — C7801 Secondary malignant neoplasm of right lung: Secondary | ICD-10-CM

## 2023-09-10 ENCOUNTER — Ambulatory Visit (HOSPITAL_COMMUNITY)
Admission: RE | Admit: 2023-09-10 | Discharge: 2023-09-10 | Disposition: A | Discharge status: Home / Self Care | Source: Ambulatory Visit | Attending: Medical Oncology | Admitting: Medical Oncology

## 2023-09-10 DIAGNOSIS — C7801 Secondary malignant neoplasm of right lung: Secondary | ICD-10-CM | POA: Diagnosis present

## 2023-09-10 MED ORDER — GADOBUTROL 1 MMOL/ML IV SOLN
10.0000 mL | Freq: Once | INTRAVENOUS | Status: AC | PRN
  Administered 2023-09-10: 10 mL via INTRAVENOUS

## 2023-09-24 ENCOUNTER — Institutional Professional Consult (permissible substitution): Payer: 59 | Admitting: Emergency Medicine

## 2024-05-04 ENCOUNTER — Emergency Department (HOSPITAL_BASED_OUTPATIENT_CLINIC_OR_DEPARTMENT_OTHER)

## 2024-05-04 ENCOUNTER — Encounter (HOSPITAL_BASED_OUTPATIENT_CLINIC_OR_DEPARTMENT_OTHER): Payer: Self-pay | Admitting: Emergency Medicine

## 2024-05-04 ENCOUNTER — Other Ambulatory Visit: Payer: Self-pay

## 2024-05-04 ENCOUNTER — Emergency Department (HOSPITAL_BASED_OUTPATIENT_CLINIC_OR_DEPARTMENT_OTHER)
Admission: EM | Admit: 2024-05-04 | Discharge: 2024-05-04 | Disposition: A | Discharge status: Home / Self Care | Attending: Emergency Medicine | Admitting: Emergency Medicine

## 2024-05-04 DIAGNOSIS — C439 Malignant melanoma of skin, unspecified: Secondary | ICD-10-CM | POA: Diagnosis not present

## 2024-05-04 DIAGNOSIS — S7001XA Contusion of right hip, initial encounter: Secondary | ICD-10-CM | POA: Diagnosis not present

## 2024-05-04 DIAGNOSIS — S79911A Unspecified injury of right hip, initial encounter: Secondary | ICD-10-CM | POA: Diagnosis present

## 2024-05-04 DIAGNOSIS — W19XXXA Unspecified fall, initial encounter: Secondary | ICD-10-CM | POA: Insufficient documentation

## 2024-05-04 DIAGNOSIS — Z79899 Other long term (current) drug therapy: Secondary | ICD-10-CM | POA: Insufficient documentation

## 2024-05-04 MED ORDER — METHOCARBAMOL 500 MG PO TABS
500.0000 mg | ORAL_TABLET | Freq: Three times a day (TID) | ORAL | 0 refills | Status: AC | PRN
Start: 2024-05-04 — End: ?

## 2024-05-04 NOTE — ED Notes (Signed)
 Pt alert and oriented X 4 at the time of discharge. RR even and unlabored. No acute distress noted. Pt verbalized understanding of discharge instructions as discussed. Pt ambulatory to lobby at time of discharge.

## 2024-05-04 NOTE — ED Provider Notes (Signed)
 [facility] Provider Note   CSN: [id number] Arrival date & time: 05/04/24  9293     Patient presents with: Hip Pain   Amanda Baker is a 53 y.o. female.    Hip Pain  Patient with fall and right hip pain.  Fell around 5 days ago.  Since then has had more pain on the right superior hip.  Worse with laying on it.  Worse with certain movements.  He is on immunotherapy for metastatic melanoma.  No numbness or weakness.  No back pain.  Not on blood thinners.     Prior to Admission medications   Medication Sig Start Date End Date Taking? Authorizing Provider  levothyroxine (SYNTHROID) 88 MCG tablet Take 88 mcg by mouth daily. 01/11/24 01/10/25 Yes [provider]  methocarbamol (ROBAXIN) 500 MG tablet Take 1 tablet (500 mg total) by mouth every 8 (eight) hours as needed. 05/04/24  Yes Patsey Lot, MD  mometasone (ELOCON) 0.1 % cream Apply 1 Application topically daily. 04/08/24 04/08/25 Yes [provider]  norethindrone-ethinyl estradiol  (FEMHRT 1/5) 1-5 MG-MCG TABS tablet Take 1 tablet by mouth daily. 09/11/23  Yes [provider]  VRAYLAR 1.5 MG capsule Take 1.5 mg by mouth daily. 03/24/24  Yes [provider]  acetaminophen  (TYLENOL ) 325 MG tablet Take 650 mg by mouth every 6 (six) hours as needed.    [provider]  busPIRone (BUSPAR) 10 MG tablet Take 15 mg by mouth 3 (three) times daily. 1 in am  and  200 pm    [provider]  estradiol -norethindrone (ACTIVELLA) 1-0.5 MG tablet Take 1 tablet by mouth daily.    [provider]  ibuprofen  (ADVIL ) 200 MG tablet Take 600 mg by mouth every 6 (six) hours as needed for mild pain (pain score 1-3).    [provider]  nystatin  (MYCOSTATIN /NYSTOP ) powder Apply 1 application topically 3 (three) times daily. Apply to folds under abdomen and other affected areas. Patient not taking: Reported on 08/26/2023 07/18/21    Marilynne Rosaline SAILOR, MD  solifenacin  (VESICARE ) 10 MG tablet Take 1 tablet (10 mg total) by mouth daily. Patient not taking: Reported on 08/26/2023 09/18/21   Marilynne Rosaline SAILOR, MD  traZODone (DESYREL) 50 MG tablet Take 100 mg by mouth at bedtime.    [provider]  vortioxetine HBr (TRINTELLIX) 20 MG TABS tablet Take 20 mg by mouth daily.    [provider]    Allergies: Penicillins, Erythromycin, Codeine, Lisinopril, and Bactrim  [sulfamethoxazole -trimethoprim ]    Review of Systems  Updated Vital Signs BP 137/68   Pulse 81   Temp 98.7 F (37.1 C)   Resp 18   Wt 118.4 kg   SpO2 98%   BMI 49.32 kg/m   Physical Exam Vitals and nursing note reviewed.  Cardiovascular:     Rate and Rhythm: Regular rhythm.  Abdominal:     Tenderness: There is no abdominal tenderness. There is no guarding.  Musculoskeletal:        General: Tenderness present.     Comments: Some tenderness over iliac crest on the right.  No deformity.  No ecchymosis.  Good range of motion in right hip.  No knee tenderness.  No lumbar tenderness.  Neurological:     Mental Status: She is alert.     (all labs ordered are listed, but only abnormal results are displayed) Labs Reviewed - No data to display  EKG: None  Radiology: DG Hip Unilat W  or Wo Pelvis 2-3 Views Right Result Date: 05/04/2024 EXAM: 2 or 3 VIEW(S) XRAY OF THE RIGHT HIP 05/04/2024 08:00:49 AM COMPARISON: None available. CLINICAL HISTORY: fall, fall FINDINGS: BONES AND JOINTS: No acute fracture or focal osseous lesion. The hip joint is maintained without joint space narrowing. Minimal osteophyte formation of right hip is noted. SOFT TISSUES: The soft tissues are unremarkable. IMPRESSION: 1. No acute fracture or dislocation. 2. Minimal right hip osteophyte formation without joint space narrowing. Electronically signed by: Lynwood Seip MD 05/04/2024 08:28 AM EST RP Workstation: [id number]     Procedures   Medications Ordered  in the ED - No data to display                                  Medical Decision Making Amount and/or Complexity of Data Reviewed Radiology: ordered.  Risk Prescription drug management.   Patient with right hip pain.  Or upper pelvis laterally.  Happened after fall.  Differential diagnosis does include fractures but also causes such as bruising.  No lumbar tenderness.  Will get x-ray to evaluate.  Reviewed oncology notes from her melanoma.  Reviewed PET scan from around a month ago that showed no bony mets.  X-ray done here and reassuring.  Has no fracture but some arthritis.  Will treat with muscle relaxers.  Appears stable for discharge home.     Final diagnoses:  Fall, initial encounter  Contusion of right hip, initial encounter    ED Discharge Orders          Ordered    methocarbamol  (ROBAXIN ) 500 MG tablet  Every 8 hours PRN        05/04/24 0835               Patsey Lot, MD 05/04/24 [phone]

## 2024-05-04 NOTE — ED Triage Notes (Signed)
 Fell 5 days ago , right hip pain , ambulatory with steady gait .
